# Patient Record
Sex: Female | Born: 1980 | Race: White | Hispanic: No | Marital: Married | Smoking: Never smoker
Health system: Southern US, Community
[De-identification: ages and names within clinical notes are randomized; demographics above are authoritative.]

## PROBLEM LIST (undated history)

## (undated) ENCOUNTER — Inpatient Hospital Stay (HOSPITAL_COMMUNITY): Payer: Self-pay

## (undated) DIAGNOSIS — Z789 Other specified health status: Secondary | ICD-10-CM

## (undated) DIAGNOSIS — R87629 Unspecified abnormal cytological findings in specimens from vagina: Secondary | ICD-10-CM

## (undated) HISTORY — PX: COLPOSCOPY VULVA W/ BIOPSY: SUR282

## (undated) HISTORY — PX: ADENOIDECTOMY AND MYRINGOTOMY WITH TUBE PLACEMENT: SHX5714

## (undated) HISTORY — PX: KNEE SURGERY: SHX244

---

## 2011-04-10 ENCOUNTER — Encounter: Payer: Self-pay | Admitting: *Deleted

## 2011-04-10 ENCOUNTER — Emergency Department (HOSPITAL_BASED_OUTPATIENT_CLINIC_OR_DEPARTMENT_OTHER)
Admission: EM | Admit: 2011-04-10 | Discharge: 2011-04-11 | Disposition: A | Discharge status: Home / Self Care | Payer: BC Managed Care – PPO | Attending: Emergency Medicine | Admitting: Emergency Medicine

## 2011-04-10 ENCOUNTER — Emergency Department (INDEPENDENT_AMBULATORY_CARE_PROVIDER_SITE_OTHER): Payer: BC Managed Care – PPO

## 2011-04-10 DIAGNOSIS — J029 Acute pharyngitis, unspecified: Secondary | ICD-10-CM

## 2011-04-10 DIAGNOSIS — R061 Stridor: Secondary | ICD-10-CM

## 2011-04-10 DIAGNOSIS — R064 Hyperventilation: Secondary | ICD-10-CM | POA: Insufficient documentation

## 2011-04-10 DIAGNOSIS — R109 Unspecified abdominal pain: Secondary | ICD-10-CM

## 2011-04-10 LAB — DIFFERENTIAL
Basophils Absolute: 0 10*3/uL (ref 0.0–0.1)
Basophils Relative: 0 % (ref 0–1)
Eosinophils Relative: 1 % (ref 0–5)
Monocytes Absolute: 1.5 10*3/uL — ABNORMAL HIGH (ref 0.1–1.0)
Neutro Abs: 12.4 10*3/uL — ABNORMAL HIGH (ref 1.7–7.7)

## 2011-04-10 LAB — BASIC METABOLIC PANEL
BUN: 13 mg/dL (ref 6–23)
Calcium: 10.3 mg/dL (ref 8.4–10.5)
Chloride: 99 mEq/L (ref 96–112)
Creatinine, Ser: 0.8 mg/dL (ref 0.50–1.10)
GFR calc Af Amer: >90 mL/min (ref >90)

## 2011-04-10 LAB — CBC
HCT: 42.4 % (ref 36.0–46.0)
MCHC: 35.1 g/dL (ref 30.0–36.0)
MCV: 88.5 fL (ref 78.0–100.0)
Platelets: 364 10*3/uL (ref 150–400)
RDW: 12.2 % (ref 11.5–15.5)

## 2011-04-10 LAB — RAPID STREP SCREEN (MED CTR MEBANE ONLY): Streptococcus, Group A Screen (Direct): NEGATIVE

## 2011-04-10 MED ORDER — ACETAMINOPHEN 325 MG PO TABS
650.0000 mg | ORAL_TABLET | Freq: Once | ORAL | Status: AC
  Administered 2011-04-10: 650 mg via ORAL

## 2011-04-10 MED ORDER — DEXAMETHASONE SODIUM PHOSPHATE 10 MG/ML IJ SOLN
10.0000 mg | Freq: Once | INTRAMUSCULAR | Status: AC
  Administered 2011-04-10: 10 mg via INTRAVENOUS

## 2011-04-10 MED ORDER — RACEPINEPHRINE HCL 2.25 % IN NEBU
0.5000 mL | INHALATION_SOLUTION | Freq: Once | RESPIRATORY_TRACT | Status: AC
  Administered 2011-04-10: 1 mL via RESPIRATORY_TRACT

## 2011-04-10 MED ORDER — SODIUM CHLORIDE 0.9 % IV SOLN
Freq: Once | INTRAVENOUS | Status: AC
  Administered 2011-04-10: 23:00:00 via INTRAVENOUS

## 2011-04-10 MED ORDER — ACETAMINOPHEN 160 MG/5ML PO SOLN
ORAL | Status: AC
  Filled 2011-04-10: qty 20.3

## 2011-04-10 MED ORDER — ACETAMINOPHEN 325 MG PO TABS
ORAL_TABLET | ORAL | Status: AC
  Administered 2011-04-10: 650 mg via ORAL
  Filled 2011-04-10: qty 2

## 2011-04-10 NOTE — ED Notes (Signed)
Pt c/o sore throat that began last night. Tonight pt became SOB and has a croupy cough. Pt able to swallow without difficulty.

## 2011-04-10 NOTE — ED Notes (Signed)
Pt bypassed triage and taken to room 13 due to emergent nature. Dr. Read Drivers and RT at bedside on arrival.

## 2011-04-10 NOTE — ED Notes (Signed)
After breathing treatment and IV steroids pt's breathing is now non-labored and pt does not have croupy cough. Pt's respirations have slowed and are even and regular. Pt no longer in respiratory distress.

## 2011-04-10 NOTE — ED Provider Notes (Addendum)
Scribed for Hanley Seamen, MD, the patient was seen in room 13 . This chart was scribed by Ellie Lunch.   CSN: [id number] Arrival date & time: 04/10/2011 10:40 PM   First MD Initiated Contact with Patient 04/10/11 2233      Chief Complaint  Patient presents with  . Respiratory Distress    (Consider location/radiation/quality/duration/timing/severity/associated sxs/prior treatment) HPI Level 5 caveat for urgent need for intervention. Pt developed sore throat last night. Tonight became SOB and has croupy cough. Pt arrived in mild respiratory distress. Nursing staff noted Pt had temp of 103. Respiratory therapist started racemic epinephrine.   History reviewed. No pertinent past medical history.  Past Surgical History  Procedure Date  . Knee surgery     No family history on file.  History  Substance Use Topics  . Smoking status: Never Smoker   . Smokeless tobacco: Not on file  . Alcohol Use: No    Review of Systems  Unable to perform ROS: Other  Constitutional: Positive for fever.  Respiratory: Positive for cough and shortness of breath.   Need for immediate intervention  Allergies  Review of patient's allergies indicates no known allergies.  Home Medications  No current outpatient prescriptions on file.  BP 164/99  Pulse 144  Temp(Src) 103 F (39.4 C) (Oral)  Resp 28  SpO2 100%  LMP 03/11/2011  Physical Exam  Nursing note and vitals reviewed. Constitutional: She is oriented to person, place, and time. She appears well-developed. She appears distressed (mildly).  HENT:  Head: Normocephalic and atraumatic.  Mouth/Throat: Oropharynx is clear and moist. No oropharyngeal exudate.  Eyes: Conjunctivae and EOM are normal.  Neck: Neck supple.  Cardiovascular: Regular rhythm.        tachycardic  Pulmonary/Chest:       stridorus and dysphonia Mild respiratory distress tachypnea  Abdominal: Soft. There is no tenderness.  Musculoskeletal: She exhibits no edema  and no tenderness.       caropedal spasms on extremeties  Neurological: She is alert and oriented to person, place, and time.  Skin: Skin is warm and dry.    ED Course  Procedures (including critical care time)  Oxygen saturation is 100% on room air, normal by my interpretation.   Nursing notes and vitals signs, including pulse oximetry, reviewed.   Summary of this visit's results, reviewed by myself:  Results for orders placed during the hospital encounter of 04/10/11  RAPID STREP SCREEN      Component Value Range   Streptococcus, Group A Screen (Direct) NEGATIVE  NEGATIVE   BASIC METABOLIC PANEL      Component Value Range   Sodium 139  135 - 145 (mEq/L)   Potassium 3.0 (*) 3.5 - 5.1 (mEq/L)   Chloride 99  96 - 112 (mEq/L)   CO2 23  19 - 32 (mEq/L)   Glucose, Bld 127 (*) 70 - 99 (mg/dL)   BUN 13  6 - 23 (mg/dL)   Creatinine, Ser 2.72  0.50 - 1.10 (mg/dL)   Calcium 53.6  8.4 - 10.5 (mg/dL)   GFR calc non Af Amer >90  >90 (mL/min)   GFR calc Af Amer >90  >90 (mL/min)  CBC      Component Value Range   WBC 18.4 (*) 4.0 - 10.5 (K/uL)   RBC 4.79  3.87 - 5.11 (MIL/uL)   Hemoglobin 14.9  12.0 - 15.0 (g/dL)   HCT 64.4  03.4 - 74.2 (%)   MCV 88.5  78.0 - 100.0 (fL)  MCH 31.1  26.0 - 34.0 (pg)   MCHC 35.1  30.0 - 36.0 (g/dL)   RDW 09.8  11.9 - 14.7 (%)   Platelets 364  150 - 400 (K/uL)  DIFFERENTIAL      Component Value Range   Neutrophils Relative 67  43 - 77 (%)   Neutro Abs 12.4 (*) 1.7 - 7.7 (K/uL)   Lymphocytes Relative 24  12 - 46 (%)   Lymphs Abs 4.4 (*) 0.7 - 4.0 (K/uL)   Monocytes Relative 8  3 - 12 (%)   Monocytes Absolute 1.5 (*) 0.1 - 1.0 (K/uL)   Eosinophils Relative 1  0 - 5 (%)   Eosinophils Absolute 0.2  0.0 - 0.7 (K/uL)   Basophils Relative 0  0 - 1 (%)   Basophils Absolute 0.0  0.0 - 0.1 (K/uL)   Dg Neck Soft Tissue  04/10/2011  *RADIOLOGY REPORT*  Clinical Data: Dyspnea.  Sore throat.  Question obstruction.  NECK SOFT TISSUES - 1+ VIEW  Comparison:  None.  Findings: Single lateral soft tissue view of the neck.  The airway is widely patent.  No prevertebral soft tissue swelling. Epiglottis normal.  Aryepiglottic folds within normal limits.  No radiopaque foreign object.  IMPRESSION: No evidence of obstruction or explanation for dyspnea/sore throat.  Original Report Authenticated By: Consuello Bossier, M.D.    10:47 PM Stridor relieved and  dyspnea improved after racemic epinephrine. Instructed to slow breathing to improve carpopedal spasms.    MDM  I personally performed the services described in this documentation, which was scribed in my presence.  The recorded information has been reviewed and considered.  12:10 AM Patient resting comfortably. Tachycardia is improved. No stridor heard. This finding has improved. We'll start antibiotics for possible bacterial etiology.  1:54 AM Patient observed in ED for 3 hours post racemic epinephrine. Patient continues to be stridor free. She is comfortable. She is slightly dysphonic. Oropharynx still without edema or exudate. Patient's significant other will be with her tonight and will return her or call EMS should she worsen.  CRITICAL CARE Performed by: Paula Libra L   Total critical care time: 35  Critical care time was exclusive of separately billable procedures and treating other patients.  Critical care was necessary to treat or prevent imminent or life-threatening deterioration.  Critical care was time spent personally by me on the following activities: development of treatment plan with patient and/or surrogate as well as nursing, discussions with consultants, evaluation of patient's response to treatment, examination of patient, obtaining history from patient or surrogate, ordering and performing treatments and interventions, ordering and review of laboratory studies, ordering and review of radiographic studies, pulse oximetry and re-evaluation of patient's condition.     Hanley Seamen,  MD 04/11/11 8295  Hanley Seamen, MD 04/11/11 0157

## 2011-04-10 NOTE — ED Notes (Signed)
Pt unable to swallow tylenol tablets. Liquid tylenol used instead.

## 2011-04-10 NOTE — ED Notes (Signed)
Pt continues to improve, breathing is not labored. Pt speaking in complete sentences. Pt ambulated to bathroom with minimal assistance from family due to foot pain.

## 2011-04-11 MED ORDER — PENICILLIN G BENZATHINE 1200000 UNIT/2ML IM SUSP
1.2000 10*6.[IU] | Freq: Once | INTRAMUSCULAR | Status: AC
  Administered 2011-04-11: 1.2 10*6.[IU] via INTRAMUSCULAR
  Filled 2011-04-11: qty 2

## 2011-04-17 LAB — CULTURE, BLOOD (ROUTINE X 2)

## 2012-03-18 LAB — OB RESULTS CONSOLE ANTIBODY SCREEN: Antibody Screen: NEGATIVE

## 2012-03-18 LAB — OB RESULTS CONSOLE HIV ANTIBODY (ROUTINE TESTING): HIV: NONREACTIVE

## 2012-03-18 LAB — OB RESULTS CONSOLE HEPATITIS B SURFACE ANTIGEN: Hepatitis B Surface Ag: NEGATIVE

## 2012-03-18 LAB — OB RESULTS CONSOLE ABO/RH: RH Type: POSITIVE

## 2012-03-18 LAB — OB RESULTS CONSOLE GC/CHLAMYDIA: Chlamydia: NEGATIVE

## 2012-06-19 NOTE — L&D Delivery Note (Signed)
Delivery Note  First Stage: Labor onset: 0630 Augmentation : pitocin for protracted active labor with persistent OP Analgesia /Anesthesia intrapartum: epidural SROM at 0645  Second Stage: Complete dilation at 2244 Onset of pushing at 2250 FHR second stage 150 with variable to nadir 100 with pushing / + accels  Delivery of a viable female at 2329 by CNM in LOA position loose nuchal cord x 1 reduced over head at birth Cord double clamped after cessation of pulsation, cut by FOB Cord blood sample collected   Third Stage: Placenta delivered spontaneous intact with 3 VC @ 2336 Placenta disposition: for disposal Uterine tone firm / bleeding small  No perineal laceration identified / brisk bleeding from left side wall - visualized large vessel with active bleeding 2- figure of eights placed for hemostasis with 3-0 vicryl / good hemostasis without expansion at site - no evidence of bleeding or hematoma at site Anesthesia for repair: epidural  Est. Blood Loss (mL): 200  Complications: none  Mom to postpartum.  Baby to MOM for bonding.   Marlinda Mike CNM, MSN 10/06/2012, 11:55 PM

## 2012-09-13 ENCOUNTER — Encounter (HOSPITAL_COMMUNITY): Payer: Self-pay | Admitting: *Deleted

## 2012-09-13 ENCOUNTER — Inpatient Hospital Stay (HOSPITAL_COMMUNITY)
Admission: AD | Admit: 2012-09-13 | Discharge: 2012-09-13 | Disposition: A | Discharge status: Home / Self Care | Source: Ambulatory Visit | Attending: Obstetrics and Gynecology | Admitting: Obstetrics and Gynecology

## 2012-09-13 ENCOUNTER — Inpatient Hospital Stay (HOSPITAL_COMMUNITY)

## 2012-09-13 DIAGNOSIS — O368139 Decreased fetal movements, third trimester, other fetus: Secondary | ICD-10-CM

## 2012-09-13 DIAGNOSIS — O368190 Decreased fetal movements, unspecified trimester, not applicable or unspecified: Secondary | ICD-10-CM | POA: Insufficient documentation

## 2012-09-13 MED ORDER — AZITHROMYCIN 250 MG PO TABS: ORAL_TABLET | ORAL | Status: AC

## 2012-09-13 MED ORDER — BENZONATATE 200 MG PO CAPS: 200.0000 mg | ORAL_CAPSULE | Freq: Three times a day (TID) | ORAL | Status: DC | PRN

## 2012-09-13 NOTE — MAU Note (Signed)
Hasn't felt any movement today, bay is usually really active. Been sick past couple wks.  Coughing a lot.

## 2012-09-13 NOTE — MAU Note (Signed)
Patient presents to MAU with c/o decreased fetal movement. Reports she was in bed most of day then went to work at 1500. Reports she drank OJ and still did not feel baby movement.  Denies vaginal bleeding, LOF or cramping. Took Robitussin at 1000 and 1400 today; reports she took Afrin throughout the day.

## 2012-09-13 NOTE — MAU Provider Note (Signed)
  History     CSN: [id number]  Arrival date and time: 09/13/12 1823 Nurse call to provider - orders given @ 1920 Provider here to see patient @ 2015    Chief Complaint  Patient presents with  . Decreased Fetal Movement   HPI  Decreased FM all day URI - feels misearable  History reviewed. No pertinent past medical history.  Past Surgical History  Procedure Laterality Date  . Knee surgery      History reviewed. No pertinent family history.  History  Substance Use Topics  . Smoking status: Never Smoker   . Smokeless tobacco: Not on file  . Alcohol Use: No    Allergies: No Known Allergies  No prescriptions prior to admission    ROS Physical Exam   Blood pressure 134/89, pulse 89, temperature 98.2 F (36.8 C), temperature source Oral, resp. rate 20, height 5' 5.5" (1.664 m), weight 93.895 kg (207 lb).  Physical Exam  MAU Course  Procedures  NST - reactive  ULTRAOUND FETAL BPP W/O NONSTRESS  BIOPHYSICAL PROFILE  Number of Fetuses: 1  Heart Rate: 146bpm  Movement: Yes  Presentation: Cephalic  Placental Location: Not assessed  Previa: N/A  Amniotic Fluid (subjective): Not assessed  MATERNAL FINDINGS:  Cervix: Not assessed  Uterus/Adnexae: Not assessed  BPP:  Movement: 2 Time: 8 min  Breathing: 2  Tone: 2  Amniotic Fluid: 2  Total Score: 8/8  Impression:  Single live intrauterine gestation in cephalic presentation.  Fetal BPP 8/8.    Assessment and Plan  33 5/7 decreased FM - reactive NST / Normal AFI / BPP Fetal kick counts  Supportive care for URI   Marlinda Mike 09/13/2012, 8:19 PM

## 2012-10-06 ENCOUNTER — Inpatient Hospital Stay (HOSPITAL_COMMUNITY)
Admission: AD | Admit: 2012-10-06 | Discharge: 2012-10-08 | DRG: 775 | Disposition: A | Discharge status: Home / Self Care | Source: Ambulatory Visit | Attending: Obstetrics and Gynecology | Admitting: Obstetrics and Gynecology

## 2012-10-06 ENCOUNTER — Inpatient Hospital Stay (HOSPITAL_COMMUNITY): Admitting: Anesthesiology

## 2012-10-06 ENCOUNTER — Encounter (HOSPITAL_COMMUNITY): Payer: Self-pay | Admitting: Anesthesiology

## 2012-10-06 DIAGNOSIS — IMO0001 Reserved for inherently not codable concepts without codable children: Secondary | ICD-10-CM

## 2012-10-06 DIAGNOSIS — O139 Gestational [pregnancy-induced] hypertension without significant proteinuria, unspecified trimester: Principal | ICD-10-CM | POA: Diagnosis present

## 2012-10-06 LAB — CBC
HCT: 37.1 % (ref 36.0–46.0)
HCT: 36.3 % (ref 36.0–46.0)
Hemoglobin: 12.5 g/dL (ref 12.0–15.0)
Hemoglobin: 12.4 g/dL (ref 12.0–15.0)
MCH: 31 pg (ref 26.0–34.0)
MCH: 31.2 pg (ref 26.0–34.0)
MCHC: 33.7 g/dL (ref 30.0–36.0)
MCHC: 34.2 g/dL (ref 30.0–36.0)
MCV: 92.1 fL (ref 78.0–100.0)
MCV: 91.4 fL (ref 78.0–100.0)
Platelets: 222 10*3/uL (ref 150–400)
Platelets: 203 10*3/uL (ref 150–400)
RBC: 4.03 MIL/uL (ref 3.87–5.11)
RBC: 3.97 MIL/uL (ref 3.87–5.11)
RDW: 14 % (ref 11.5–15.5)
RDW: 13.8 % (ref 11.5–15.5)
WBC: 13.1 10*3/uL — ABNORMAL HIGH (ref 4.0–10.5)
WBC: 21.7 10*3/uL — ABNORMAL HIGH (ref 4.0–10.5)

## 2012-10-06 LAB — COMPREHENSIVE METABOLIC PANEL
ALT: 20 U/L (ref 0–35)
AST: 17 U/L (ref 0–37)
Albumin: 2.5 g/dL — ABNORMAL LOW (ref 3.5–5.2)
Alkaline Phosphatase: 136 U/L — ABNORMAL HIGH (ref 39–117)
BUN: 8 mg/dL (ref 6–23)
CO2: 22 mEq/L (ref 19–32)
Calcium: 9.3 mg/dL (ref 8.4–10.5)
Chloride: 103 mEq/L (ref 96–112)
Creatinine, Ser: 0.67 mg/dL (ref 0.50–1.10)
GFR calc Af Amer: >90 mL/min (ref >90)
GFR calc non Af Amer: >90 mL/min (ref >90)
Glucose, Bld: 80 mg/dL (ref 70–99)
Potassium: 3.9 mEq/L (ref 3.5–5.1)
Sodium: 136 mEq/L (ref 135–145)
Total Bilirubin: 0.2 mg/dL — ABNORMAL LOW (ref 0.3–1.2)
Total Protein: 5.4 g/dL — ABNORMAL LOW (ref 6.0–8.3)

## 2012-10-06 LAB — URIC ACID: Uric Acid, Serum: 5 mg/dL (ref 2.4–7.0)

## 2012-10-06 LAB — LACTATE DEHYDROGENASE: LDH: 158 U/L (ref 94–250)

## 2012-10-06 LAB — RPR: RPR Ser Ql: NONREACTIVE

## 2012-10-06 MED ORDER — ACETAMINOPHEN 325 MG PO TABS: 650.0000 mg | ORAL_TABLET | ORAL | Status: DC | PRN

## 2012-10-06 MED ORDER — SODIUM CHLORIDE 0.9 % IJ SOLN: 3.0000 mL | INTRAMUSCULAR | Status: DC | PRN

## 2012-10-06 MED ORDER — FENTANYL 2.5 MCG/ML BUPIVACAINE 1/10 % EPIDURAL INFUSION (WH - ANES)
INTRAMUSCULAR | Status: DC | PRN
  Administered 2012-10-06: 14 mL/h via EPIDURAL

## 2012-10-06 MED ORDER — PHENYLEPHRINE 40 MCG/ML (10ML) SYRINGE FOR IV PUSH (FOR BLOOD PRESSURE SUPPORT)
80.0000 ug | PREFILLED_SYRINGE | INTRAVENOUS | Status: DC | PRN
  Filled 2012-10-06: qty 2

## 2012-10-06 MED ORDER — LACTATED RINGERS IV SOLN: 500.0000 mL | Freq: Once | INTRAVENOUS | Status: DC

## 2012-10-06 MED ORDER — OXYTOCIN BOLUS FROM INFUSION
500.0000 mL | INTRAVENOUS | Status: DC
  Administered 2012-10-06: 500 mL via INTRAVENOUS

## 2012-10-06 MED ORDER — LACTATED RINGERS IV SOLN: 500.0000 mL | INTRAVENOUS | Status: DC | PRN

## 2012-10-06 MED ORDER — SALINE SPRAY 0.65 % NA SOLN
1.0000 | NASAL | Status: DC | PRN
  Filled 2012-10-06: qty 44

## 2012-10-06 MED ORDER — FENTANYL 2.5 MCG/ML BUPIVACAINE 1/10 % EPIDURAL INFUSION (WH - ANES)
14.0000 mL/h | INTRAMUSCULAR | Status: DC | PRN
  Filled 2012-10-06: qty 125

## 2012-10-06 MED ORDER — EPHEDRINE 5 MG/ML INJ
10.0000 mg | INTRAVENOUS | Status: DC | PRN
  Filled 2012-10-06: qty 2
  Filled 2012-10-06: qty 4

## 2012-10-06 MED ORDER — OXYTOCIN 40 UNITS IN LACTATED RINGERS INFUSION - SIMPLE MED
1.0000 m[IU]/min | INTRAVENOUS | Status: DC
  Administered 2012-10-06: 2 m[IU]/min via INTRAVENOUS
  Filled 2012-10-06: qty 1000

## 2012-10-06 MED ORDER — SODIUM CHLORIDE 0.9 % IJ SOLN: 3.0000 mL | Freq: Two times a day (BID) | INTRAMUSCULAR | Status: DC

## 2012-10-06 MED ORDER — LIDOCAINE HCL (PF) 1 % IJ SOLN
30.0000 mL | INTRAMUSCULAR | Status: DC | PRN
  Filled 2012-10-06 (×2): qty 30

## 2012-10-06 MED ORDER — IBUPROFEN 600 MG PO TABS: 600.0000 mg | ORAL_TABLET | Freq: Four times a day (QID) | ORAL | Status: DC | PRN

## 2012-10-06 MED ORDER — CITRIC ACID-SODIUM CITRATE 334-500 MG/5ML PO SOLN: 30.0000 mL | ORAL | Status: DC | PRN

## 2012-10-06 MED ORDER — DIPHENHYDRAMINE HCL 50 MG/ML IJ SOLN: 12.5000 mg | INTRAMUSCULAR | Status: DC | PRN

## 2012-10-06 MED ORDER — PHENYLEPHRINE 40 MCG/ML (10ML) SYRINGE FOR IV PUSH (FOR BLOOD PRESSURE SUPPORT)
80.0000 ug | PREFILLED_SYRINGE | INTRAVENOUS | Status: DC | PRN
  Filled 2012-10-06: qty 2
  Filled 2012-10-06: qty 5

## 2012-10-06 MED ORDER — OXYTOCIN 40 UNITS IN LACTATED RINGERS INFUSION - SIMPLE MED: 62.5000 mL/h | INTRAVENOUS | Status: DC

## 2012-10-06 MED ORDER — SODIUM CHLORIDE 0.9 % IV SOLN: 250.0000 mL | INTRAVENOUS | Status: DC | PRN

## 2012-10-06 MED ORDER — LIDOCAINE HCL (PF) 1 % IJ SOLN
INTRAMUSCULAR | Status: DC | PRN
  Administered 2012-10-06 (×2): 5 mL
  Administered 2012-10-06: 3 mL

## 2012-10-06 MED ORDER — EPHEDRINE 5 MG/ML INJ
10.0000 mg | INTRAVENOUS | Status: DC | PRN
  Filled 2012-10-06: qty 2

## 2012-10-06 NOTE — Progress Notes (Signed)
S:  pressure - urge to push  O:  VS: Blood pressure 148/80, pulse 87, temperature 99.4 F (37.4 C), temperature source Axillary, resp. rate 20, height 5' 5.5" (1.664 m), weight 210 lb (95.255 kg), SpO2 100.00%.        FHR : baseline 155 / variability moderate / accels + / decels variable to nadir 100        Toco: contractions every 2-3 minutes / pitocin 4 mu/min         Cervix : complete vtx + 3        Membranes: clear fluid / + show  A: Complete dilation - begin active second stage     P: anticipate SVB in next hour     Marlinda Mike CNM, MSN 10/06/2012, [phone]

## 2012-10-06 NOTE — MAU Note (Signed)
Pt presents with complaints of her water broke between 630-650. States fluid was blood tinged, contractions started right before her water broke.

## 2012-10-06 NOTE — Anesthesia Preprocedure Evaluation (Signed)

## 2012-10-06 NOTE — MAU Provider Note (Signed)
  History     CSN: [id number]  Arrival date and time: 10/06/12 4098 Provider here to evaluate patient @ 0800    No chief complaint on file.  HPI  water broke - reports bloody fluid constant cramping and abdominal tightness + CTX Denies headache or vision changes  No past medical history on file.  Past Surgical History  Procedure Laterality Date  . Knee surgery      No family history on file.  History  Substance Use Topics  . Smoking status: Never Smoker   . Smokeless tobacco: Not on file  . Alcohol Use: No    Allergies: No Known Allergies  Prescriptions prior to admission  Medication Sig Dispense Refill  . benzonatate (TESSALON) 200 MG capsule Take 1 capsule (200 mg total) by mouth 3 (three) times daily as needed for cough.  20 capsule  0    ROS Physical Exam   There were no vitals taken for this visit.  Physical Exam  Temp:97.8  Pulse:79  Resp:20   BP  Initial:152/105 recheck lateral position:130/80 Uncomfortable - painful ctx - breathing with ctx Heart RR Lungs clear - no wheezing (recent bronchitis) / non-productive cough present Abdomen soft with active BS / uterus gravid and non-tender Grossly ruptured with moderate amount clear fluid / + ferning Cervix: 3cm / 80% / vtx / 0 station ROT / small show in fluid Edema: 1+  DTR 3+ brisk without clonus  FHR 145 baseline / moderate variability / no decels / no accels  MAU Course  Procedures  Assessment and Plan  37 weeks with SROM latent labor elevated BP  1) admit 2) PIH signs 3) discuss labor and pain management option once labs resulted   Casey Todd 10/06/2012, 8:03 AM

## 2012-10-06 NOTE — Progress Notes (Signed)
S:  Pressure with ctx - feeling urge to push  O:  VS: Blood pressure 136/78, pulse 96, temperature 98.5 F (36.9 C), temperature source Oral, resp. rate 20, height 5' 5.5" (1.664 m), weight 210 lb (95.255 kg).        FHR : 155 no decels audible        Toco: contractions every 3-4 minutes         Cervix : anterior rim / vtx + 2        Membranes: bloody show         Out of tub to void        Water temperature 98.5 - warm water added to 99  A: transition of labor  P: labor support   Marlinda Mike CNM, MSN 10/06/2012, 5:26 PM

## 2012-10-06 NOTE — Progress Notes (Addendum)
S: laboring in tub - breathing hard with ctx      moaning - pain getting worse   O:  VS: Blood pressure 143/82, pulse 91, temperature 98.7 F (37.1 C), temperature source Oral, resp. rate 20, height 5' 5.5" (1.664 m), weight 210 lb (95.255 kg).        FHR : baseline 150 / variability moderate / accels + / decels none prior to entering tub                  Intermittent EFM while in tub        Toco: contractions every 2 minutes / strong         Cervix : 8cm / 100% vtx / +1        Membranes: clear fluid / + show        Water temp 99.1 @ 1430 when patient entered water  A: active labor - BP stable / no PEC     FHR category 1 - now intermittent EFMm monitoring  P: expectant management       continuous labor support      water immersion in labor - possible waterbirth  Marlinda Mike CNM, MSN 10/06/2012, 3:13 PM

## 2012-10-06 NOTE — Progress Notes (Signed)
S:  Comfortable now with epidural  O:  VS: Blood pressure 140/86, pulse 90, temperature 97.9 F (36.6 C), temperature source Oral, resp. rate 18, height 5' 5.5" (1.664 m), weight 210 lb (95.255 kg), SpO2 99.00%.        FHR : baseline 155 / variability moderate / accels + / decels none        Toco: contractions every 2-4 minutes / moderate with some coupling         Cervix : anterior rim / persistent OP        Membranes: clear with show  Labored in tub - no progression past 9cm in 2 hours  Out of tub  - recheck cervix: anterior rim / vtx + 1 persistent OP extended asynclitic  IUPC placed to assess ctx - MVU 160-180 range IUPC out while up to bathroom - not replaced due to patient discomfort / intense pain with ctx awaiting epidural  Recommend epidural and pitocin augmentation to facilitate SVB - patient agrees  Cervix exam after epidural anterior rim / extended vtx OP - manual flexion of vtx  A: protracted active labor     FHR category 1  P: pitocin augment now      attempt SVB      Dr Seymour Bars update on status     Casey Todd CNM, MSN 10/06/2012, 9:05 PM

## 2012-10-06 NOTE — Progress Notes (Signed)
Difficult to trace continually due to maternal position on birthing ball in shower.  Pt reports good fetal movement.

## 2012-10-06 NOTE — Progress Notes (Signed)
S:  ctx really hard- some pressure with ctx       laboring in bathtub but unable to get comfortable due to restriction of space       pain at IV site   O:  VS: Blood pressure 134/76, pulse 93, temperature 98.5 F (36.9 C), temperature source Oral, resp. rate 20, height 5' 5.5" (1.664 m), weight 210 lb (95.255 kg).        BP stable        FHR : baseline 150 / variability moderate / accels + / decels none        Toco: contractions every 2-3 minutes / moderate         Cervix : 6-7cm / 100% / vtx +1 asynclitic         Membranes: clear fluid with show        IV catheter bent / infiltrated - IV removed   A: active labor - desires waterbirth (no tub supplies obtained due to gestational age)     Gestational hypertension     FHR category 1  P: expectant management      attempting to locate waterbirth supplies for patient      maternal positioning and water immersion to facilitate fetal positioning   Marlinda Mike CNM, MSN 10/06/2012, 1:27 PM

## 2012-10-06 NOTE — H&P (Signed)
  OB ADMISSION/ HISTORY & PHYSICAL:  Admission Date: 10/06/2012  7:56 AM  Admit Diagnosis: SROM / latent labor / elevated BP  Casey Todd is a 32 y.o. female presenting for labor onset and SROM.  Prenatal History: G1P0   EDC : 10/27/2012, Alternate EDD Entry  Prenatal care at [facility] Provider: Marlinda Mike CNM Prenatal course complicated by bronchitis  Prenatal Labs: ABO, Rh:   O positive Antibody:  negative Rubella:   immune RPR:  NR  HBsAg:   negative HIV:   NR GBS:   negative 1 hr Glucola : NL  Medical / Surgical History :  Past medical history: No past medical history on file.   Past surgical history:  Past Surgical History  Procedure Laterality Date  . Knee surgery      Family History: No family history on file.   Social History:  reports that she has never smoked. She does not have any smokeless tobacco history on file. She reports that she does not drink alcohol or use illicit drugs.  Allergies: Other   Current Medications at time of admission:  Prenatal  Review of Systems: Ctx onset with SROM at 0700  Physical Exam:  VS: Blood pressure 130/80, pulse 79, temperature 97.8 F (36.6 C), temperature source Oral, resp. rate 18.  General: alert and oriented, appears uncomfortable with ctx Heart: RRR Lungs: Clear lung fields Abdomen: Gravid, soft and non-tender, non-distended / uterus: soft / gravid / non-tender Extremities: 2+ dependent edema  Genitalia / VE: Dilation: 3 Effacement (%): 80 Station: 0 Exam by:: Marlinda Mike CNM  FHR: baseline rate 145 / variability moderate / accels none / decels none TOCO: ctx every 2-4 minutes mild - moderate  Assessment: [redacted] weeks gestation latent stage of labor FHR category 1   Plan:  Admit PIH labs Dr [place] notified of admission / plan of care   Marlinda Mike CNM, MSN 10/06/2012, 8:24 AM

## 2012-10-06 NOTE — Progress Notes (Signed)
S:  Ctx getting stronger       laboring in chair / birthing ball in shower  O:  VS: Blood pressure 145/101, pulse 69, temperature 97.9 F (36.6 C), temperature source Oral, resp. rate 20, height 5' 5.5" (1.664 m), weight 210 lb (95.255 kg).  BP range: 136-145/ 80-101         FHR : baseline 150 / variability moderate        Toco: contractions every 2-3 minutes / moderate        Cervix : 5cm /80% vtx / 0 station        Membranes: clear fluid with bloody show  PIH labs NL  A: active labor     gestational hypertension - no evidence of PEC     FHR category 1  P: expectant management     Marlinda Mike CNM, MSN 10/06/2012, 12:10 PM

## 2012-10-06 NOTE — Anesthesia Procedure Notes (Signed)
Epidural Patient location during procedure: OB  Staffing Anesthesiologist: Mattalynn Crandle Performed by: anesthesiologist   Preanesthetic Checklist Completed: patient identified, site marked, surgical consent, pre-op evaluation, timeout performed, IV checked, risks and benefits discussed and monitors and equipment checked  Epidural Patient position: sitting Prep: ChloraPrep Patient monitoring: heart rate, continuous pulse ox and blood pressure Approach: midline Injection technique: LOR saline  Needle:  Needle type: Tuohy  Needle gauge: 17 G Needle length: 9 cm and 9 Needle insertion depth: 6 cm Catheter type: closed end flexible Catheter size: 20 Guage Catheter at skin depth: 12 cm Test dose: negative  Assessment Events: blood not aspirated, injection not painful, no injection resistance, negative IV test and no paresthesia  Additional Notes   Patient tolerated the insertion well without complications.   

## 2012-10-07 ENCOUNTER — Encounter (HOSPITAL_COMMUNITY): Payer: Self-pay | Admitting: Family Medicine

## 2012-10-07 LAB — ABO/RH: ABO/RH(D): O POS

## 2012-10-07 MED ORDER — BENZOCAINE-MENTHOL 20-0.5 % EX AERO
1.0000 "application " | INHALATION_SPRAY | CUTANEOUS | Status: DC | PRN
  Filled 2012-10-07: qty 56

## 2012-10-07 MED ORDER — OXYCODONE-ACETAMINOPHEN 5-325 MG PO TABS: 1.0000 | ORAL_TABLET | ORAL | Status: DC | PRN

## 2012-10-07 MED ORDER — ONDANSETRON HCL 4 MG/2ML IJ SOLN: 4.0000 mg | INTRAMUSCULAR | Status: DC | PRN

## 2012-10-07 MED ORDER — ONDANSETRON HCL 4 MG PO TABS: 4.0000 mg | ORAL_TABLET | ORAL | Status: DC | PRN

## 2012-10-07 MED ORDER — DIPHENHYDRAMINE HCL 25 MG PO CAPS: 25.0000 mg | ORAL_CAPSULE | Freq: Four times a day (QID) | ORAL | Status: DC | PRN

## 2012-10-07 MED ORDER — SENNOSIDES-DOCUSATE SODIUM 8.6-50 MG PO TABS
2.0000 | ORAL_TABLET | Freq: Every day | ORAL | Status: DC
  Administered 2012-10-07: 2 via ORAL

## 2012-10-07 MED ORDER — WITCH HAZEL-GLYCERIN EX PADS: 1.0000 "application " | MEDICATED_PAD | CUTANEOUS | Status: DC | PRN

## 2012-10-07 MED ORDER — SIMETHICONE 80 MG PO CHEW: 80.0000 mg | CHEWABLE_TABLET | ORAL | Status: DC | PRN

## 2012-10-07 MED ORDER — IBUPROFEN 600 MG PO TABS
600.0000 mg | ORAL_TABLET | Freq: Four times a day (QID) | ORAL | Status: DC
  Administered 2012-10-07 – 2012-10-08 (×6): 600 mg via ORAL
  Filled 2012-10-07 (×6): qty 1

## 2012-10-07 MED ORDER — DIBUCAINE 1 % RE OINT: 1.0000 "application " | TOPICAL_OINTMENT | RECTAL | Status: DC | PRN

## 2012-10-07 MED ORDER — LANOLIN HYDROUS EX OINT: TOPICAL_OINTMENT | CUTANEOUS | Status: DC | PRN

## 2012-10-07 NOTE — Anesthesia Postprocedure Evaluation (Signed)
  Anesthesia Post-op Note  Patient: Casey Todd  Procedure(s) Performed: * No procedures listed *  Patient Location: Mother/Baby  Anesthesia Type:Epidural  Level of Consciousness: awake, alert  and oriented  Airway and Oxygen Therapy: Patient Spontanous Breathing  Post-op Pain: mild  Post-op Assessment: Patient's Cardiovascular Status Stable and Respiratory Function Stable  Post-op Vital Signs: stable  Complications: No apparent anesthesia complications

## 2012-10-07 NOTE — Progress Notes (Signed)
Patient ID: CECEILIA CEPHUS, female   DOB: 1980/06/27, 32 y.o.   MRN: [id number] PPD # 1 G1 P1, s/p SVD  Subjective: Pt reports feeling well, tired/ Pain controlled with ibuprofen Tolerating po/ Voiding without problems/ No n/v Bleeding is moderate Newborn info:  Information for the patient's newborn:  Forest, Pruden [[id number]]  female circ desired/ Feeding: breast   Objective:  VS: Blood pressure 136/86, pulse 87, temperature 97.7 F (36.5 C), temperature source Oral, resp. rate 18.    Recent Labs  10/06/12 0830 10/06/12 2000  WBC 13.1* 21.7*  HGB 12.5 12.4  HCT 37.1 36.3  PLT 222 203    Blood type: --/--/O POS (04/20 0830) Rubella: Immune (09/30 0000)    Physical Exam:  General:  alert, cooperative and no distress CV: Regular rate and rhythm Resp: clear Abdomen: soft, nontender, normal bowel sounds Uterine Fundus: firm, below umbilicus, nontender Perineum: left labial vessel repair appears intact and no evidence of hematoma; mod right labial edema noted Lochia: mod Ext: Homans sign is negative, no sign of DVT and only mild edema, redness or tenderness in the calves or thighs   A/P: PPD # 1/ G1P1001/ S/P: SVD with left vaginal wall vessel repair, s/p successful water birth Doing well Continue routine post partum orders Anticipate D/C home in AM    Demetrius Revel, MSN, [facility] 10/07/2012, 9:49 AM

## 2012-10-08 MED ORDER — IBUPROFEN 600 MG PO TABS: 600.0000 mg | ORAL_TABLET | Freq: Four times a day (QID) | ORAL | Status: DC

## 2012-10-08 MED ORDER — IBUPROFEN 800 MG PO TABS: 800.0000 mg | ORAL_TABLET | Freq: Three times a day (TID) | ORAL | Status: DC | PRN

## 2012-10-08 NOTE — Discharge Summary (Signed)
Obstetric Discharge Summary Reason for Admission: [redacted] weeks gestation with onset latent labor; desires water birth Prenatal Procedures: ultrasound Intrapartum Procedures: spontaneous vaginal delivery Postpartum Procedures: none Complications-Operative and Postpartum: none Hemoglobin  Date Value Range Status  10/06/2012 12.4  12.0 - 15.0 g/dL Final     HCT  Date Value Range Status  10/06/2012 36.3  36.0 - 46.0 % Final    Physical Exam:  General: alert, cooperative and no distress Lochia: appropriate Uterine Fundus: firm Incision: n/a DVT Evaluation: no evidence of DVT, no edema  Discharge Diagnoses: G1 P1 @ 37wks s/p SVD with minor left labial blood vessel repair, successful water birth  Discharge Information: Date: 10/08/2012 Activity: pelvic rest Diet: routine Medications: PNV, Ibuprofen and Colace Condition: stable Instructions: refer to practice specific booklet Discharge to: home Follow-up Information   Follow up with Marlinda Mike, CNM In 6 weeks.   Contact information:   [name] [place] [address] [phone]       Newborn Data: Live born female on 10/06/12 Birth Weight: 6 lb 10.5 oz (3020 g) APGAR: 8, 9  Home with mother.  Casey Todd 10/08/2012, 9:03 AM

## 2012-10-08 NOTE — Progress Notes (Signed)
Spoke with patient about TDap vaccine and VIS given.  Patient declines vaccine at this time.  Earl Gala, [facility] [place]

## 2012-10-08 NOTE — Progress Notes (Signed)
Patient ID: Casey Todd, female   DOB: 1980-10-04, 32 y.o.   MRN: [id number]  PPD # 2 G1 P1 S/P SVD  Subjective: Pt reports feeling well and eager for d/c home later today/ Pain controlled with ibuprofen Tolerating po/ Voiding without problems/ No n/v Bleeding is moderate/ Newborn info:  Information for the patient's newborn:  Casey Todd [[id number]]  female  / circ deferred due to hypospadias/ Feeding: breast    Objective:  VS: Blood pressure 133/89, pulse 76, temperature 97.9 F (36.6 C), temperature source Oral, resp. rate 20.    Recent Labs  10/06/12 0830 10/06/12 2000  WBC 13.1* 21.7*  HGB 12.5 12.4  HCT 37.1 36.3  PLT 222 203    Blood type: --/--/O POS (04/20 0830) Rubella: Immune (09/30 0000)    Physical Exam:  General:  alert, cooperative and no distress CV: Regular rate and rhythm Resp: clear Abdomen: soft, nontender, normal bowel sounds Uterine Fundus: firm, below umbilicus, nontender Perineum: mod edema persists; left labial vessel repair is intact and without evidence of hematoma Lochia: moderate Ext: edema trace and Homans sign is negative, no sign of DVT    A/P: PPD # 2/ G1P1001/ S/P: SVD with simple left labial blood vessel repair Doing well and stable for discharge home RX: Ibuprofen 600mg  po Q 6 hrs prn pain #30 Refill x 1 Colace OTC prn WOB/GYN booklet given Routine pp visit in 6wks   Demetrius Revel, MSN, [facility] 10/08/2012, 9:01 AM

## 2013-06-18 LAB — OB RESULTS CONSOLE HIV ANTIBODY (ROUTINE TESTING): HIV: NONREACTIVE

## 2013-06-19 NOTE — L&D Delivery Note (Signed)
Delivery Note  First Stage: Labor onset: 1236 Augmentation : none Analgesia /Anesthesia intrapartum: none AROM at 2109  Second Stage: Complete dilation at 2109 Onset of pushing at 2109 FHR second stage 140  Delivery of a viable female at 2111 by CNM in OA position True knot in cord Cord double clamped immediately, cut by FOB Cord blood sample collected   Third Stage: Placenta delivered via Tomasa BlaseSchultz intact with 3 VC @ 2201 Placenta disposition: L&D Uterine tone firm / bleeding brisk, moderate / Cytotec 800 mcg given rectally  2nd degree vaginal and perineal laceration identified  Anesthesia for repair: 1% lidocaine Repair 3.0 vicryl Est. Blood Loss (mL): 400  Complications: retained placenta x 50 mins  Mom to postpartum.  Baby to Couplet care / Skin to Skin.  Newborn: Birth Weight: 7 lbs 0.9 oz  Apgar Scores: 8/9 Feeding planned: breast  Casey Todd, Casey Todd, M  MSN, CNM 01/30/2014, 10:33 PM

## 2013-07-01 LAB — OB RESULTS CONSOLE GC/CHLAMYDIA
Chlamydia: NEGATIVE
Gonorrhea: NEGATIVE

## 2013-11-13 LAB — OB RESULTS CONSOLE RPR: RPR: NONREACTIVE

## 2014-01-25 ENCOUNTER — Encounter (HOSPITAL_COMMUNITY): Payer: Self-pay

## 2014-01-25 ENCOUNTER — Inpatient Hospital Stay (HOSPITAL_COMMUNITY)
Admission: AD | Admit: 2014-01-25 | Discharge: 2014-01-26 | Disposition: A | Discharge status: Home / Self Care | Payer: BC Managed Care – PPO | Source: Ambulatory Visit | Attending: Obstetrics | Admitting: Obstetrics

## 2014-01-25 DIAGNOSIS — O479 False labor, unspecified: Secondary | ICD-10-CM | POA: Insufficient documentation

## 2014-01-25 DIAGNOSIS — M549 Dorsalgia, unspecified: Secondary | ICD-10-CM | POA: Insufficient documentation

## 2014-01-25 HISTORY — DX: Other specified health status: Z78.9

## 2014-01-25 HISTORY — DX: Unspecified abnormal cytological findings in specimens from vagina: R87.629

## 2014-01-25 LAB — OB RESULTS CONSOLE GBS: GBS: NEGATIVE

## 2014-01-26 ENCOUNTER — Encounter (HOSPITAL_COMMUNITY): Payer: Self-pay | Admitting: *Deleted

## 2014-01-26 LAB — POCT FERN TEST: POCT Fern Test: NEGATIVE

## 2014-01-26 NOTE — MAU Note (Signed)
Pt reports she had some trickling since 930 tonight and c/o back pain and ctx. Good fetal movement reported.

## 2014-01-30 ENCOUNTER — Encounter (HOSPITAL_COMMUNITY): Payer: Self-pay | Admitting: Anesthesiology

## 2014-01-30 ENCOUNTER — Inpatient Hospital Stay (HOSPITAL_COMMUNITY)
Admission: AD | Admit: 2014-01-30 | Discharge: 2014-02-01 | DRG: 774 | Disposition: A | Discharge status: Home / Self Care | Payer: BC Managed Care – PPO | Source: Ambulatory Visit | Attending: Obstetrics | Admitting: Obstetrics

## 2014-01-30 ENCOUNTER — Inpatient Hospital Stay (HOSPITAL_COMMUNITY)
Admission: AD | Admit: 2014-01-30 | Discharge: 2014-01-30 | Disposition: A | Discharge status: Home / Self Care | Payer: BC Managed Care – PPO | Source: Ambulatory Visit | Attending: Obstetrics | Admitting: Obstetrics

## 2014-01-30 ENCOUNTER — Encounter (HOSPITAL_COMMUNITY): Payer: Self-pay | Admitting: *Deleted

## 2014-01-30 DIAGNOSIS — IMO0001 Reserved for inherently not codable concepts without codable children: Secondary | ICD-10-CM

## 2014-01-30 DIAGNOSIS — O479 False labor, unspecified: Secondary | ICD-10-CM | POA: Diagnosis present

## 2014-01-30 LAB — TYPE AND SCREEN
ABO/RH(D): O POS
Antibody Screen: NEGATIVE

## 2014-01-30 LAB — CBC
HEMATOCRIT: 39.2 % (ref 36.0–46.0)
HEMOGLOBIN: 13.4 g/dL (ref 12.0–15.0)
MCH: 32 pg (ref 26.0–34.0)
MCHC: 34.2 g/dL (ref 30.0–36.0)
MCV: 93.6 fL (ref 78.0–100.0)
Platelets: 203 10*3/uL (ref 150–400)
RBC: 4.19 MIL/uL (ref 3.87–5.11)
RDW: 14.8 % (ref 11.5–15.5)
WBC: 14.2 10*3/uL — ABNORMAL HIGH (ref 4.0–10.5)

## 2014-01-30 MED ORDER — FENTANYL 2.5 MCG/ML BUPIVACAINE 1/10 % EPIDURAL INFUSION (WH - ANES)
INTRAMUSCULAR | Status: AC
  Filled 2014-01-30: qty 125

## 2014-01-30 MED ORDER — EPHEDRINE 5 MG/ML INJ
10.0000 mg | INTRAVENOUS | Status: DC | PRN
  Filled 2014-01-30: qty 2

## 2014-01-30 MED ORDER — OXYTOCIN 40 UNITS IN LACTATED RINGERS INFUSION - SIMPLE MED
999.0000 mL/h | Freq: Once | INTRAVENOUS | Status: AC
  Administered 2014-01-30: 999 mL/h via INTRAVENOUS

## 2014-01-30 MED ORDER — LIDOCAINE HCL (PF) 1 % IJ SOLN
30.0000 mL | INTRAMUSCULAR | Status: AC | PRN
  Administered 2014-01-30 (×2): 30 mL via SUBCUTANEOUS
  Filled 2014-01-30 (×2): qty 30

## 2014-01-30 MED ORDER — DIPHENHYDRAMINE HCL 50 MG/ML IJ SOLN: 12.5000 mg | INTRAMUSCULAR | Status: DC | PRN

## 2014-01-30 MED ORDER — PHENYLEPHRINE 40 MCG/ML (10ML) SYRINGE FOR IV PUSH (FOR BLOOD PRESSURE SUPPORT)
80.0000 ug | PREFILLED_SYRINGE | INTRAVENOUS | Status: DC | PRN
  Filled 2014-01-30: qty 2

## 2014-01-30 MED ORDER — IBUPROFEN 600 MG PO TABS
600.0000 mg | ORAL_TABLET | Freq: Four times a day (QID) | ORAL | Status: DC | PRN
  Administered 2014-01-30: 600 mg via ORAL
  Filled 2014-01-30: qty 1

## 2014-01-30 MED ORDER — PHENYLEPHRINE 40 MCG/ML (10ML) SYRINGE FOR IV PUSH (FOR BLOOD PRESSURE SUPPORT)
PREFILLED_SYRINGE | INTRAVENOUS | Status: AC
  Filled 2014-01-30: qty 5

## 2014-01-30 MED ORDER — FENTANYL 2.5 MCG/ML BUPIVACAINE 1/10 % EPIDURAL INFUSION (WH - ANES): 14.0000 mL/h | INTRAMUSCULAR | Status: DC | PRN

## 2014-01-30 MED ORDER — MISOPROSTOL 200 MCG PO TABS
ORAL_TABLET | ORAL | Status: AC
Start: 2014-01-30 — End: 2014-01-31
  Filled 2014-01-30: qty 4

## 2014-01-30 MED ORDER — CITRIC ACID-SODIUM CITRATE 334-500 MG/5ML PO SOLN: 30.0000 mL | ORAL | Status: DC | PRN

## 2014-01-30 MED ORDER — OXYTOCIN 40 UNITS IN LACTATED RINGERS INFUSION - SIMPLE MED
INTRAVENOUS | Status: AC
  Administered 2014-01-30: 40 [IU]
  Filled 2014-01-30: qty 1000

## 2014-01-30 MED ORDER — MISOPROSTOL 200 MCG PO TABS
800.0000 ug | ORAL_TABLET | Freq: Once | ORAL | Status: AC
  Administered 2014-01-30: 800 ug via RECTAL

## 2014-01-30 MED ORDER — OXYCODONE-ACETAMINOPHEN 5-325 MG PO TABS
2.0000 | ORAL_TABLET | Freq: Once | ORAL | Status: AC
  Administered 2014-01-30: 2 via ORAL
  Filled 2014-01-30: qty 2

## 2014-01-30 MED ORDER — LACTATED RINGERS IV SOLN: 500.0000 mL | INTRAVENOUS | Status: DC | PRN

## 2014-01-30 MED ORDER — LACTATED RINGERS IV SOLN
500.0000 mL | Freq: Once | INTRAVENOUS | Status: AC
  Administered 2014-01-30: 21:00:00 via INTRAVENOUS

## 2014-01-30 MED ORDER — OXYCODONE-ACETAMINOPHEN 5-325 MG PO TABS: 1.0000 | ORAL_TABLET | ORAL | Status: DC | PRN

## 2014-01-30 MED ORDER — ONDANSETRON HCL 4 MG/2ML IJ SOLN
4.0000 mg | Freq: Once | INTRAMUSCULAR | Status: AC
  Administered 2014-01-30: 4 mg via INTRAVENOUS
  Filled 2014-01-30: qty 2

## 2014-01-30 MED ORDER — ACETAMINOPHEN 325 MG PO TABS: 650.0000 mg | ORAL_TABLET | ORAL | Status: DC | PRN

## 2014-01-30 MED ORDER — OXYTOCIN 40 UNITS IN LACTATED RINGERS INFUSION - SIMPLE MED
62.5000 mL/h | INTRAVENOUS | Status: DC
  Administered 2014-01-30: 62.5 mL/h via INTRAVENOUS

## 2014-01-30 MED ORDER — OXYTOCIN 10 UNIT/ML IJ SOLN: 10.0000 [IU] | Freq: Once | INTRAMUSCULAR | Status: AC | PRN

## 2014-01-30 MED ORDER — PHENYLEPHRINE 40 MCG/ML (10ML) SYRINGE FOR IV PUSH (FOR BLOOD PRESSURE SUPPORT)
80.0000 ug | PREFILLED_SYRINGE | INTRAVENOUS | Status: DC | PRN
Start: 2014-01-30 — End: 2014-01-31
  Filled 2014-01-30: qty 2

## 2014-01-30 NOTE — MAU Note (Signed)
Contracting every 3min for the past 2 hrs. No bleeding or leaking. No problems with preg. Was 3/80/-2 earlier today.

## 2014-01-30 NOTE — Progress Notes (Addendum)
Patient ID: Casey Todd, female   DOB: Oct 16, 1980, 33 y.o.   MRN: [id number] Patient very anxious and requesting epidural.  Comfort measures offered and other alternatives for pain management discussed to help cope with pain while filling water tub for delivery.  Discussed the need to AROM to be able to assess fluid color before entry into water birth tub.  Patient insistent on getting epidural for pain management stating, "I don't think I could do this any longer without an epidural!"  IV and Epidural orders placed in EPIC. / 1 failed attempt at IV by RN / 2nd IV attempt by CNM with 18G catheter in LT forearm without difficulty - LR infusing at bolus rate.  [name], [name], M MSN, CNM 01/30/2014, 8:55 PM

## 2014-01-30 NOTE — Progress Notes (Signed)
Patient ID: Fara Borosmy M Hallmark, female   DOB: Jan 22, 1981, 33 y.o.   MRN: [id number]   Subjective: Jalexia Michell HeinrichM Commerford is a 33 y.o. G2P1001 at 2658w1d by ultrasound admitted for active labor and advanced dilation.  Objective: Filed Vitals:   01/30/14 2008  Temp: 98 F (36.7 C)  TempSrc: Oral  Height: 5\' 4"  (1.626 m)  Weight: 92.08 kg (203 lb)         FHT:  FHR: 140 bpm, variability: moderate,  accelerations:  Present,  decelerations:  Absent UC:   regular, every 2-4 minutes SVE:   Dilation: 7 Effacement (%): 90 Station: 0 Exam by:: CIT GroupDawson CNM  Labs: Results for orders placed during the hospital encounter of 01/30/14 (from the past 24 hour(s))  CBC     Status: Abnormal   Collection Time    01/30/14  8:25 PM      Result Value Ref Range   WBC 14.2 (*) 4.0 - 10.5 K/uL   RBC 4.19  3.87 - 5.11 MIL/uL   Hemoglobin 13.4  12.0 - 15.0 g/dL   HCT 40.939.2  81.136.0 - 91.446.0 %   MCV 93.6  78.0 - 100.0 fL   MCH 32.0  26.0 - 34.0 pg   MCHC 34.2  30.0 - 36.0 g/dL   RDW 78.214.8  95.611.5 - 21.315.5 %   Platelets 203  150 - 400 K/uL    Assessment / Plan: Spontaneous labor, progressing normally  Labor: Progressing normally Preeclampsia:  N/A Fetal Wellbeing:  Category I Pain Control:  Labor support without medications / requesting an epidural I/D:  n/a Anticipated MOD:  NSVD  Kenard GowerAWSON, Annison Birchard, M, MSN, CNM 01/30/2014, 8:43 PM

## 2014-01-30 NOTE — Anesthesia Preprocedure Evaluation (Deleted)
Anesthesia Evaluation  Patient identified by MRN, date of birth, ID band Patient awake    Reviewed: Allergy & Precautions, H&P , Patient's Chart, lab work & pertinent test results  Airway Mallampati: II TM Distance: >3 FB Neck ROM: full    Dental   Pulmonary  breath sounds clear to auscultation        Cardiovascular Rhythm:regular Rate:Normal     Neuro/Psych    GI/Hepatic   Endo/Other    Renal/GU      Musculoskeletal   Abdominal   Peds  Hematology   Anesthesia Other Findings   Reproductive/Obstetrics (+) Pregnancy                           Anesthesia Physical Anesthesia Plan  ASA: II  Anesthesia Plan: Epidural   Post-op Pain Management:    Induction:   Airway Management Planned:   Additional Equipment:   Intra-op Plan:   Post-operative Plan:   Informed Consent: I have reviewed the patients History and Physical, chart, labs and discussed the procedure including the risks, benefits and alternatives for the proposed anesthesia with the patient or authorized representative who has indicated his/her understanding and acceptance.     Plan Discussed with:   Anesthesia Plan Comments:        uanble to place secondary to baby coming out Anesthesia Quick Evaluation

## 2014-01-30 NOTE — MAU Note (Signed)
Patient brought from lobby directly to MAU room. Patient reports contractions every 2 minutes. Denies LOF or VB. +FM. Doppler FHR 143. SVE 7/90/-2 BBOW. Patient straight to room 161 for admit via stretcher.  Provider called and aware of patients status.

## 2014-01-30 NOTE — H&P (Signed)
OB ADMISSION/ HISTORY & PHYSICAL:  Admission Date: 01/30/2014  7:56 PM  Admit Diagnosis: Active Labor and Advanced Dilation  Casey Todd is a 33 y.o. female presenting for contractions every 5 minutes since being discharged from hospital.  Prenatal History: G2P1001   EDC : 01/29/2014, by ultrasound  Prenatal care at [facility] since 7.[redacted] weeks gestation Primary Care Provider at [facility]: Marlinda Mike, CNM  Prenatal course complicated by Abnormal glucose tolerance  Prenatal Labs: ABO, Rh:  O positive Antibody: NEG (08/14 2025) Rubella:  Immune RPR: Nonreactive (05/28 0000)  HBsAg:  Negative HIV: Non-reactive (12/31 0000)  GBS: Negative (08/09 0000)  1 hr Glucola : 135 mg/dL   Medical / Surgical History :  Past medical history:  Past Medical History  Diagnosis Date  . Medical history non-contributory   . Vaginal Pap smear, abnormal   . Postpartum care following vaginal delivery (8/14) 01/30/2014     Past surgical history:  Past Surgical History  Procedure Laterality Date  . Knee surgery    . Colposcopy vulva w/ biopsy    . Adenoidectomy and myringotomy with tube placement       Family History:  Family History  Problem Relation Age of Onset  . Alcohol abuse Neg Hx   . Arthritis Neg Hx   . Asthma Neg Hx   . Birth defects Neg Hx   . Cancer Neg Hx   . COPD Neg Hx   . Depression Neg Hx   . Diabetes Neg Hx   . Drug abuse Neg Hx   . Early death Neg Hx   . Hearing loss Neg Hx   . Heart disease Neg Hx   . Hyperlipidemia Neg Hx   . Hypertension Neg Hx   . Kidney disease Neg Hx   . Learning disabilities Neg Hx   . Mental illness Neg Hx   . Mental retardation Neg Hx   . Miscarriages / Stillbirths Neg Hx   . Stroke Neg Hx   . Vision loss Neg Hx   . Varicose Veins Neg Hx      Social History:  reports that she has never smoked. She does not have any smokeless tobacco history on file. She reports that she does not drink alcohol or use illicit  drugs.   Allergies: Other - severe N/V with narcotics   Current Medications at time of admission:  Prescriptions prior to admission  Medication Sig Dispense Refill  . calcium carbonate (TUMS - DOSED IN MG ELEMENTAL CALCIUM) 500 MG chewable tablet Chew 2 tablets by mouth daily as needed for heartburn.       . Prenatal Vit-Min-FA-Fish Oil ([employer] PRENATAL GUMMY PO) Take 1 each by mouth daily.          Review of Systems: Review of Systems  Constitutional: Negative.   HENT: Negative.   Eyes: Negative.   Cardiovascular: Negative.   Gastrointestinal: Negative.   Genitourinary: Negative.        Regular UC's every 5 mins  Musculoskeletal: Negative.   Skin: Negative.   Neurological: Negative.   Endo/Heme/Allergies: Negative.   Psychiatric/Behavioral: Negative.      Prenatal Transfer Tool  Maternal Diabetes: No Genetic Screening: Normal Maternal Ultrasounds/Referrals: Normal Fetal Ultrasounds or other Referrals:  None Maternal Substance Abuse:  No Significant Maternal Medications:  None Significant Maternal Lab Results:  None Other Comments:  None  Physical Exam: Dilation: 7 Effacement (%): 90 Station: 0 Exam by:: Arita Miss CNM VS: 137/85, 84, 22, 97  General: A&O x 3, increased anxiety and distress with contractions Heart: RRR, no murmurs Lungs: CTAB Abdomen: gravid, firm with contractions, normal bowel sounds Pelvic: **see PE above Extremities: atraumatic, normal ROM, no edema FHR: 140 / moderate variability / accels present / decels absent TOCO: regular every 5 mins  Labs:     Recent Labs  01/30/14 2025  WBC 14.2*  HGB 13.4  HCT 39.2  PLT 203     Assessment:  33 y.o. G2P1001 at 7551w1d  1. Labor: active 2. Fetal Wellbeing: Category 1  3. Pain Control: none 4. GBS: neagtive 5. Elevated Blood Pressure  Plan:  1. Admit to BS 2. Routine L&D orders 3. Analgesia/anesthesia PRN     Dr Ernestina PennaFogleman notified of admission / plan of care    Kenard GowerDAWSON, Maudine Kluesner, M,  MSN, CNM 01/30/2014, 11:24 PM

## 2014-01-30 NOTE — Discharge Instructions (Signed)
Braxton Hicks Contractions °Contractions of the uterus can occur throughout pregnancy. Contractions are not always a sign that you are in labor.  °WHAT ARE BRAXTON HICKS CONTRACTIONS?  °Contractions that occur before labor are called Braxton Hicks contractions, or false labor. Toward the end of pregnancy (32-34 weeks), these contractions can develop more often and may become more forceful. This is not true labor because these contractions do not result in opening (dilatation) and thinning of the cervix. They are sometimes difficult to tell apart from true labor because these contractions can be forceful and people have different pain tolerances. You should not feel embarrassed if you go to the hospital with false labor. Sometimes, the only way to tell if you are in true labor is for your health care provider to look for changes in the cervix. °If there are no prenatal problems or other health problems associated with the pregnancy, it is completely safe to be sent home with false labor and await the onset of true labor. °HOW CAN YOU TELL THE DIFFERENCE BETWEEN TRUE AND FALSE LABOR? °False Labor °· The contractions of false labor are usually shorter and not as hard as those of true labor.   °· The contractions are usually irregular.   °· The contractions are often felt in the front of the lower abdomen and in the groin.   °· The contractions may go away when you walk around or change positions while lying down.   °· The contractions get weaker and are shorter lasting as time goes on.   °· The contractions do not usually become progressively stronger, regular, and closer together as with true labor.   °True Labor °· Contractions in true labor last 30-70 seconds, become very regular, usually become more intense, and increase in frequency.   °· The contractions do not go away with walking.   °· The discomfort is usually felt in the top of the uterus and spreads to the lower abdomen and low back.   °· True labor can be  determined by your health care provider with an exam. This will show that the cervix is dilating and getting thinner.   °WHAT TO REMEMBER °· Keep up with your usual exercises and follow other instructions given by your health care provider.   °· Take medicines as directed by your health care provider.   °· Keep your regular prenatal appointments.   °· Eat and drink lightly if you think you are going into labor.   °· If Braxton Hicks contractions are making you uncomfortable:   °¨ Change your position from lying down or resting to walking, or from walking to resting.   °¨ Sit and rest in a tub of warm water.   °¨ Drink 2-3 glasses of water. Dehydration may cause these contractions.   °¨ Do slow and deep breathing several times an hour.   °WHEN SHOULD I SEEK IMMEDIATE MEDICAL CARE? °Seek immediate medical care if: °· Your contractions become stronger, more regular, and closer together.   °· You have fluid leaking or gushing from your vagina.   °· You have a fever.   °· You pass blood-tinged mucus.   °· You have vaginal bleeding.   °· You have continuous abdominal pain.   °· You have low back pain that you never had before.   °· You feel your baby's head pushing down and causing pelvic pressure.   °· Your baby is not moving as much as it used to.   °Document Released: 06/05/2005 Document Revised: 06/10/2013 Document Reviewed: 03/17/2013 °Exit[employer]® Patient Information ©2015 [employer]. This information is not intended to replace advice given to you by your health care   provider. Make sure you discuss any questions you have with your health care provider. ° °

## 2014-01-30 NOTE — Progress Notes (Signed)
Patient ID: Casey Todd, female   DOB: [birth year], 33 y.o.   MRN: [id number] Called to Room by RN to assess brisk, moderate bleeding.  Small slow trickle of dark, red blood noted only with fundal massage.  No other bleeding management needed at this time.  Observe closely.  Orders for methergine already added to postpartum orders. Plan: If bleeding increases again, given methergine and insert indwelling Foley  *Dr. Ernestina PennaFogleman notified of status - agrees with plan  Kenard GowerAWSON, Ion Gonnella, M MSN, CNM 01/30/2014 11:08 PM

## 2014-01-31 LAB — COMPREHENSIVE METABOLIC PANEL
ALK PHOS: 134 U/L — AB (ref 39–117)
ALT: 9 U/L (ref 0–35)
AST: 15 U/L (ref 0–37)
Albumin: 2.4 g/dL — ABNORMAL LOW (ref 3.5–5.2)
Anion gap: 9 (ref 5–15)
BILIRUBIN TOTAL: 0.2 mg/dL — AB (ref 0.3–1.2)
BUN: 10 mg/dL (ref 6–23)
CHLORIDE: 105 meq/L (ref 96–112)
CO2: 25 mEq/L (ref 19–32)
Calcium: 8.8 mg/dL (ref 8.4–10.5)
Creatinine, Ser: 0.74 mg/dL (ref 0.50–1.10)
GFR calc non Af Amer: >90 mL/min (ref >90)
GLUCOSE: 82 mg/dL (ref 70–99)
POTASSIUM: 4.4 meq/L (ref 3.7–5.3)
Sodium: 139 mEq/L (ref 137–147)
Total Protein: 5 g/dL — ABNORMAL LOW (ref 6.0–8.3)

## 2014-01-31 LAB — CBC
HCT: 34 % — ABNORMAL LOW (ref 36.0–46.0)
Hemoglobin: 11.5 g/dL — ABNORMAL LOW (ref 12.0–15.0)
MCH: 32 pg (ref 26.0–34.0)
MCHC: 33.8 g/dL (ref 30.0–36.0)
MCV: 94.7 fL (ref 78.0–100.0)
PLATELETS: 188 10*3/uL (ref 150–400)
RBC: 3.59 MIL/uL — AB (ref 3.87–5.11)
RDW: 14.8 % (ref 11.5–15.5)
WBC: 15.8 10*3/uL — ABNORMAL HIGH (ref 4.0–10.5)

## 2014-01-31 LAB — RPR

## 2014-01-31 LAB — URIC ACID: Uric Acid, Serum: 4.4 mg/dL (ref 2.4–7.0)

## 2014-01-31 MED ORDER — TETANUS-DIPHTH-ACELL PERTUSSIS 5-2.5-18.5 LF-MCG/0.5 IM SUSP: 0.5000 mL | Freq: Once | INTRAMUSCULAR | Status: DC

## 2014-01-31 MED ORDER — ONDANSETRON HCL 4 MG PO TABS: 4.0000 mg | ORAL_TABLET | ORAL | Status: DC | PRN

## 2014-01-31 MED ORDER — ONDANSETRON HCL 4 MG/2ML IJ SOLN: 4.0000 mg | INTRAMUSCULAR | Status: DC | PRN

## 2014-01-31 MED ORDER — OXYTOCIN 40 UNITS IN LACTATED RINGERS INFUSION - SIMPLE MED: 62.5000 mL/h | INTRAVENOUS | Status: DC | PRN

## 2014-01-31 MED ORDER — OXYCODONE-ACETAMINOPHEN 5-325 MG PO TABS
1.0000 | ORAL_TABLET | ORAL | Status: DC | PRN
  Administered 2014-01-31 (×2): 1 via ORAL
  Administered 2014-01-31: 2 via ORAL
  Administered 2014-02-01: 1 via ORAL
  Filled 2014-01-31: qty 2
  Filled 2014-01-31 (×3): qty 1

## 2014-01-31 MED ORDER — WITCH HAZEL-GLYCERIN EX PADS: 1.0000 "application " | MEDICATED_PAD | CUTANEOUS | Status: DC | PRN

## 2014-01-31 MED ORDER — SENNOSIDES-DOCUSATE SODIUM 8.6-50 MG PO TABS
2.0000 | ORAL_TABLET | ORAL | Status: DC
  Administered 2014-01-31 (×2): 2 via ORAL
  Filled 2014-01-31 (×2): qty 2

## 2014-01-31 MED ORDER — BENZOCAINE-MENTHOL 20-0.5 % EX AERO
1.0000 "application " | INHALATION_SPRAY | CUTANEOUS | Status: DC | PRN
  Administered 2014-01-31: 1 via TOPICAL
  Filled 2014-01-31: qty 56

## 2014-01-31 MED ORDER — SIMETHICONE 80 MG PO CHEW: 80.0000 mg | CHEWABLE_TABLET | ORAL | Status: DC | PRN

## 2014-01-31 MED ORDER — DIBUCAINE 1 % RE OINT: 1.0000 "application " | TOPICAL_OINTMENT | RECTAL | Status: DC | PRN

## 2014-01-31 MED ORDER — METHYLERGONOVINE MALEATE 0.2 MG PO TABS: 0.2000 mg | ORAL_TABLET | ORAL | Status: DC | PRN

## 2014-01-31 MED ORDER — LACTATED RINGERS IV SOLN: INTRAVENOUS | Status: DC

## 2014-01-31 MED ORDER — PRENATAL MULTIVITAMIN CH
1.0000 | ORAL_TABLET | Freq: Every day | ORAL | Status: DC
  Administered 2014-01-31 – 2014-02-01 (×2): 1 via ORAL
  Filled 2014-01-31 (×2): qty 1

## 2014-01-31 MED ORDER — METHYLERGONOVINE MALEATE 0.2 MG/ML IJ SOLN: 0.2000 mg | INTRAMUSCULAR | Status: DC | PRN

## 2014-01-31 MED ORDER — FERROUS SULFATE 325 (65 FE) MG PO TABS
325.0000 mg | ORAL_TABLET | Freq: Two times a day (BID) | ORAL | Status: DC
  Administered 2014-01-31 – 2014-02-01 (×3): 325 mg via ORAL
  Filled 2014-01-31 (×3): qty 1

## 2014-01-31 MED ORDER — IBUPROFEN 600 MG PO TABS
600.0000 mg | ORAL_TABLET | Freq: Four times a day (QID) | ORAL | Status: DC
  Administered 2014-01-31 – 2014-02-01 (×6): 600 mg via ORAL
  Filled 2014-01-31 (×6): qty 1

## 2014-01-31 MED ORDER — DIPHENHYDRAMINE HCL 25 MG PO CAPS: 25.0000 mg | ORAL_CAPSULE | Freq: Four times a day (QID) | ORAL | Status: DC | PRN

## 2014-01-31 NOTE — Progress Notes (Signed)
Patient ID: Casey Todd, female   DOB: 1981/05/04, 33 y.o.   MRN: [id number] PPD # 1 SVD  S:  Reports feeling well             Tolerating po/ No nausea or vomiting             Bleeding is light             Pain controlled with ibuprofen (OTC)             Up ad lib / ambulatory / voiding without difficulties    Newborn  Information for the patient's newborn:  Casey Todd, Casey Todd [[id number]]  female  breast feeding  / Circumcision ?Planning d/t to hypospadias   O:  A & O x 3, in no apparent distress              VS:  Filed Vitals:   01/30/14 2326 01/31/14 0040 01/31/14 0145 01/31/14 0545  BP: 141/79 137/87 129/76 121/70  Pulse: 87 91 77 75  Temp:  98.5 F (36.9 C) 98.4 F (36.9 C) 98 F (36.7 C)  TempSrc:  Oral Oral Oral  Resp:  18 18 18   Height:      Weight:      SpO2:  100%  98%    LABS:  Recent Labs  01/30/14 2025 01/31/14 0610  WBC 14.2* 15.8*  HGB 13.4 11.5*  HCT 39.2 34.0*  PLT 203 188    Blood type: --/--/O POS (08/14 2025)  Rubella:     I&O: I/O last 3 completed shifts: In: -  Out: 400 [Blood:400]             Lungs: Clear and unlabored  Heart: regular rate and rhythm / no murmurs  Abdomen: soft, non-tender, non-distended              Fundus: firm, non-tender, U-1  Perineum: 2nd degree repair healing well  Lochia: minimal  Extremities: No edema, no calf pain or tenderness, No Homans    A/P: PPD # 1  33 y.o., [id number]   Principal Problem:    Postpartum care following vaginal delivery (8/14)    Doing well - stable status  Routine post partum orders  Anticipate discharge tomorrow    [name], Casey Todd, M, MSN, CNM 01/31/2014, 9:18 AM

## 2014-01-31 NOTE — Lactation Note (Addendum)
This note was copied from the chart of Casey Todd. Lactation Consultation Note  P2.  Mother stated her first baby (now 9 months) was 37 weeks and she had to post pump and supplement. Mother states during her pregnancy, he milk supply dropped so she weaned 419 month old recently. Baby has breastfed x 9, 2 voids, 2 stools since birth. Mother had infant in football hold, sucks and swallows observed.   Demonstrated how to rotate baby so he breastfeeds tummy to tummy. Mother states she knows how to hand express. Reviewed late preterm feeding behavior and how this baby is different.  Mother needed encouragement. Mom encouraged to feed baby 8-12 times/24 hours and with feeding cues.  Mom made aware of O/P services, breastfeeding support groups, community resources, and our phone # for post-discharge questions.     Patient Name: Casey Pearla Dubonnetmy Stieber ONGEX'BToday's Date: 01/31/2014 Reason for consult: Initial assessment   Maternal Data Does the patient have breastfeeding experience prior to this delivery?: Yes  Feeding Feeding Type: Breast Fed Length of feed: 15 min  LATCH Score/Interventions                      Lactation Tools Discussed/Used     Consult Status Consult Status: Follow-up Date: 02/01/14 Follow-up type: In-patient    Dahlia ByesBerkelhammer, Ruth [facility] 01/31/2014, 2:39 PM

## 2014-02-01 MED ORDER — FERROUS SULFATE 325 (65 FE) MG PO TABS
325.0000 mg | ORAL_TABLET | Freq: Two times a day (BID) | ORAL | Status: AC
Start: 2014-02-01 — End: ?

## 2014-02-01 MED ORDER — IBUPROFEN 600 MG PO TABS: 600.0000 mg | ORAL_TABLET | Freq: Four times a day (QID) | ORAL | Status: AC

## 2014-02-01 MED ORDER — OXYCODONE-ACETAMINOPHEN 5-325 MG PO TABS: 1.0000 | ORAL_TABLET | ORAL | Status: AC | PRN

## 2014-02-01 NOTE — Discharge Instructions (Signed)
Nutrition for the New Mother  °A new mother needs good health and nutrition so she can have energy to take care of a new baby. Whether a mother breastfeeds or formula feeds the baby, it is important to have a well-balanced diet. Foods from all the food groups should be chosen to meet the new mother's energy needs and to give her the nutrients needed for repair and healing.  °A HEALTHY EATING PLAN °The My Pyramid plan for Moms outlines what you should eat to help you and your baby stay healthy. The energy and amount of food you need depends on whether or not you are breastfeeding. If you are breastfeeding you will need more nutrients. If you choose not to breastfeed, your nutrition goal should be to return to a healthy weight. Limiting calories may be needed if you are not breastfeeding.  °HOME CARE INSTRUCTIONS  °· For a personal plan based on your unique needs, see your Registered Dietitian or visit www.mypyramid.gov. °· Eat a variety of foods. The plan below will help guide you. The following chart has a suggested daily meal plan from the My Pyramid for Moms. °· Eat a variety of fruits and vegetables. °· Eat more dark green and orange vegetables and cooked dried beans. °· Make half your grains whole grains. Choose whole instead of refined grains. °· Choose low-fat or lean meats and poultry. °· Choose low-fat or fat-free dairy products like milk, cheese, or yogurt. °Fruits °· Breastfeeding: 2 cups °· Non-Breastfeeding: 2 cups °· What Counts as a serving? °¨ 1 cup of fruit or juice. °¨ ½ cup dried fruit. °Vegetables °· Breastfeeding: 3 cups °· Non-Breastfeeding: 2 ½ cups °· What Counts as a serving? °¨ 1 cup raw or cooked vegetables. °¨ Juice or 2 cups raw leafy vegetables. °Grains °· Breastfeeding: 8 oz °· Non-Breastfeeding: 6 oz °· What Counts as a serving? °¨ 1 slice bread. °¨ 1 oz ready-to-eat cereal. °¨ ½ cup cooked pasta, rice, or cereal. °Meat and Beans °· Breastfeeding: 6 ½ oz °· Non-Breastfeeding: 5 ½  oz °· What Counts as a serving? °¨ 1 oz lean meat, poultry, or fish °¨ ¼ cup cooked dry beans °¨ ½ oz nuts or 1 egg °¨ 1 tbs peanut butter °Milk °· Breastfeeding: 3 cups °· Non-Breastfeeding: 3 cups °· What Counts as a serving? °¨ 1 cup milk. °¨ 8 oz yogurt. °¨ 1 ½ oz cheese. °¨ 2 oz processed cheese. °TIPS FOR THE BREASTFEEDING MOM °· Rapid weight loss is not suggested when you are breastfeeding. By simply breastfeeding, you will be able to lose the weight gained during your pregnancy. Your caregiver can keep track of your weight and tell you if your weight loss is appropriate. °· Be sure to drink fluids. You may notice that you are thirstier than usual. A suggestion is to drink a glass of water or other beverage whenever you breastfeed. °· Avoid alcohol as it can be passed into your breast milk. °· Limit caffeine drinks to no more than 2 to 3 cups per day. °· You may need to keep taking your prenatal vitamin while you are breastfeeding. Talk with your caregiver about taking a vitamin or supplement. °RETURING TO A HEALTHY WEIGHT °· The My Pyramid Plan for Moms will help you return to a healthy weight. It will also provide the nutrients you need. °· You may need to limit "empty" calories. These include: °¨ High fat foods like fried foods, fatty meats, fast food, butter, and mayonnaise. °¨ High   sugar foods like sodas, jelly, candy, and sweets.  Be physically active. Include 30 minutes of exercise or more each day. Choose an activity you like such as walking, swimming, biking, or aerobics. Check with your caregiver before you start to exercise. Document Released: 09/12/2007 Document Revised: 08/28/2011 Document Reviewed: 09/12/2007 [facility] Patient Information 2015 [place], [place]. This information is not intended to replace advice given to you by your health care provider. Make sure you discuss any questions you have with your health care provider.  Breast Pumping Tips If you are breastfeeding, there may be  times when you cannot feed your baby directly. Returning to work or going on a trip are common examples. Pumping allows you to store breast milk and feed it to your baby later.  You may not get much milk when you first start to pump. Your breasts should start to make more after a few days. If you pump at the times you usually feed your baby, you may be able to keep making enough milk to feed your baby without also using formula. The more often you pump, the more milk you will produce. WHEN SHOULD I PUMP?   You can begin to pump soon after delivery. However, some experts recommend waiting about 4 weeks before giving your infant a bottle to make sure breastfeeding is going well.  If you plan to return to work, begin pumping a few weeks before. This will help you develop techniques that work best for you. It also lets you build up a supply of breast milk.   When you are with your infant, feed on demand and pump after each feeding.   When you are away from your infant for several hours, pump for about 15 minutes every 2-3 hours. Pump both breasts at the same time if you can.   If your infant has a formula feeding, make sure to pump around the same time.   If you drink any alcohol, wait 2 hours before pumping.  HOW DO I PREPARE TO PUMP? Your let-down reflexis the natural reaction to stimulation that makes your breast milk flow. It is easier to stimulate this reflex when you are relaxed. Find relaxation techniques that work for you. If you have difficulty with your let-down reflex, try these methods:   Smell one of your infant's blankets or an item of clothing.   Look at a picture or video of your infant.   Sit in a quiet, private space.   Massage the breast you plan to pump.   Place soothing warmth on the breast.   Play relaxing music.  WHAT ARE SOME GENERAL BREAST PUMPING TIPS?  Wash your hands before you pump. You do not need to wash your nipples or breasts.  There are  three ways to pump.  You can use your hand to massage and compress your breast.  You can use a handheld manual pump.  You can use an electric pump.   Make sure the suction cup (flange) on the breast pump is the right size. Place the flange directly over the nipple. If it is the wrong size or placed the wrong way, it may be painful and cause nipple damage.   If pumping is uncomfortable, apply a small amount of purified or modified lanolin to your nipple and areola.  If you are using an electric pump, adjust the speed and suction power to be more comfortable.  If pumping is painful or if you are not getting very much milk, you may  need a different type of pump. A lactation consultant can help you determine what type of pump to use.   Keep a full water bottle near you at all times. Drinking lots of fluid helps you make more milk.  You can store your milk to use later. Pumped breast milk can be stored in a sealable, sterile container or plastic bag. Label all stored breast milk with the date you pumped it.  Milk can stay out at room temperature for up to 8 hours.  You can store your milk in the refrigerator for up to 8 days.  You can store your milk in the freezer for 3 months. Thaw frozen milk using warm water. Do not put it in the microwave.  Do not smoke. Smoking can lower your milk supply and harm your infant. If you need help quitting, ask your health care provider to recommend a program.  WHEN SHOULD I CALL MY HEALTH CARE PROVIDER OR A LACTATION CONSULTANT?  You are having trouble pumping.  You are concerned that you are not making enough milk.  You have nipple pain, soreness, or redness.  You want to use birth control. Birth control pills may lower your milk supply. Talk to your health care provider about your options. Document Released: 11/23/2009 Document Revised: 06/10/2013 Document Reviewed: 03/28/2013 [facility] Patient Information 2015 [place]. This  information is not intended to replace advice given to you by your health care provider. Make sure you discuss any questions you have with your health care provider. Postpartum Depression and Baby Blues The postpartum period begins right after the birth of a baby. During this time, there is often a great amount of joy and excitement. It is also a time of many changes in the life of the parents. Regardless of how many times a mother gives birth, each child brings new challenges and dynamics to the family. It is not unusual to have feelings of excitement along with confusing shifts in moods, emotions, and thoughts. All mothers are at risk of developing postpartum depression or the "baby blues." These mood changes can occur right after giving birth, or they may occur many months after giving birth. The baby blues or postpartum depression can be mild or severe. Additionally, postpartum depression can go away rather quickly, or it can be a long-term condition.  CAUSES Raised hormone levels and the rapid drop in those levels are thought to be a main cause of postpartum depression and the baby blues. A number of hormones change during and after pregnancy. Estrogen and progesterone usually decrease right after the delivery of your baby. The levels of thyroid hormone and various cortisol steroids also rapidly drop. Other factors that play a role in these mood changes include major life events and genetics.  RISK FACTORS If you have any of the following risks for the baby blues or postpartum depression, know what symptoms to watch out for during the postpartum period. Risk factors that may increase the likelihood of getting the baby blues or postpartum depression include:  Having a personal or family history of depression.   Having depression while being pregnant.   Having premenstrual mood issues or mood issues related to oral contraceptives.  Having a lot of life stress.   Having marital conflict.    Lacking a social support network.   Having a baby with special needs.   Having health problems, such as diabetes.  SIGNS AND SYMPTOMS Symptoms of baby blues include:  Brief changes in mood, such as going  from extreme happiness to sadness.  Decreased concentration.   Difficulty sleeping.   Crying spells, tearfulness.   Irritability.   Anxiety.  Symptoms of postpartum depression typically begin within the first month after giving birth. These symptoms include:  Difficulty sleeping or excessive sleepiness.   Marked weight loss.   Agitation.   Feelings of worthlessness.   Lack of interest in activity or food.  Postpartum psychosis is a very serious condition and can be dangerous. Fortunately, it is rare. Displaying any of the following symptoms is cause for immediate medical attention. Symptoms of postpartum psychosis include:   Hallucinations and delusions.   Bizarre or disorganized behavior.   Confusion or disorientation.  DIAGNOSIS  A diagnosis is made by an evaluation of your symptoms. There are no medical or lab tests that lead to a diagnosis, but there are various questionnaires that a health care provider may use to identify those with the baby blues, postpartum depression, or psychosis. Often, a screening tool called the [place] Postnatal Depression Scale is used to diagnose depression in the postpartum period.  TREATMENT The baby blues usually goes away on its own in 1-2 weeks. Social support is often all that is needed. You will be encouraged to get adequate sleep and rest. Occasionally, you may be given medicines to help you sleep.  Postpartum depression requires treatment because it can last several months or longer if it is not treated. Treatment may include individual or group therapy, medicine, or both to address any social, physiological, and psychological factors that may play a role in the depression. Regular exercise, a healthy diet,  rest, and social support may also be strongly recommended.  Postpartum psychosis is more serious and needs treatment right away. Hospitalization is often needed. HOME CARE INSTRUCTIONS  Get as much rest as you can. Nap when the baby sleeps.   Exercise regularly. Some women find yoga and walking to be beneficial.   Eat a balanced and nourishing diet.   Do little things that you enjoy. Have a cup of tea, take a bubble bath, read your favorite magazine, or listen to your favorite music.  Avoid alcohol.   Ask for help with household chores, cooking, grocery shopping, or running errands as needed. Do not try to do everything.   Talk to people close to you about how you are feeling. Get support from your partner, family members, friends, or other new moms.  Try to stay positive in how you think. Think about the things you are grateful for.   Do not spend a lot of time alone.   Only take over-the-counter or prescription medicine as directed by your health care provider.  Keep all your postpartum appointments.   Let your health care provider know if you have any concerns.  SEEK MEDICAL CARE IF: You are having a reaction to or problems with your medicine. SEEK IMMEDIATE MEDICAL CARE IF:  You have suicidal feelings.   You think you may harm the baby or someone else. MAKE SURE YOU:  Understand these instructions.  Will watch your condition.  Will get help right away if you are not doing well or get worse. Document Released: 03/09/2004 Document Revised: 06/10/2013 Document Reviewed: 03/17/2013 [facility] Patient Information 2015 [place], [place]. This information is not intended to replace advice given to you by your health care provider. Make sure you discuss any questions you have with your health care provider. Breastfeeding and Mastitis Mastitis is inflammation of the breast tissue. It can occur in women who  are breastfeeding. This can make breastfeeding painful. Mastitis  will sometimes go away on its own. Your health care provider will help determine if treatment is needed. CAUSES Mastitis is often associated with a blocked milk (lactiferous) duct. This can happen when too much milk builds up in the breast. Causes of excess milk in the breast can include:  Poor latch-on. If your baby is not latched onto the breast properly, she or he may not empty your breast completely while breastfeeding.  Allowing too much time to pass between feedings.  Wearing a bra or other clothing that is too tight. This puts extra pressure on the lactiferous ducts so milk does not flow through them as it should. Mastitis can also be caused by a bacterial infection. Bacteria may enter the breast tissue through cuts or openings in the skin. In women who are breastfeeding, this may occur because of cracked or irritated skin. Cracks in the skin are often caused when your baby does not latch on properly to the breast. SIGNS AND SYMPTOMS  Swelling, redness, tenderness, and pain in an area of the breast.  Swelling of the glands under the arm on the same side.  Fever may or may not accompany mastitis. If an infection is allowed to progress, a collection of pus (abscess) may develop. DIAGNOSIS  Your health care provider can usually diagnose mastitis based on your symptoms and a physical exam. Tests may be done to help confirm the diagnosis. These may include:  Removal of pus from the breast by applying pressure to the area. This pus can be examined in the lab to determine which bacteria are present. If an abscess has developed, the fluid in the abscess can be removed with a needle. This can also be used to confirm the diagnosis and determine the bacteria present. In most cases, pus will not be present.  Blood tests to determine if your body is fighting a bacterial infection.  Mammogram or ultrasound tests to rule out other problems or diseases. TREATMENT  Mastitis that occurs with  breastfeeding will sometimes go away on its own. Your health care provider may choose to wait 24 hours after first seeing you to decide whether a prescription medicine is needed. If your symptoms are worse after 24 hours, your health care provider will likely prescribe an antibiotic medicine to treat the mastitis. He or she will determine which bacteria are most likely causing the infection and will then select an appropriate antibiotic medicine. This is sometimes changed based on the results of tests performed to identify the bacteria, or if there is no response to the antibiotic medicine selected. Antibiotic medicines are usually given by mouth. You may also be given medicine for pain. HOME CARE INSTRUCTIONS  Only take over-the-counter or prescription medicines for pain, fever, or discomfort as directed by your health care provider.  If your health care provider prescribed an antibiotic medicine, take the medicine as directed. Make sure you finish it even if you start to feel better.  Do not wear a tight or underwire bra. Wear a soft, supportive bra.  Increase your fluid intake, especially if you have a fever.  Continue to empty the breast. Your health care provider can tell you whether this milk is safe for your infant or needs to be thrown out. You may be told to stop nursing until your health care provider thinks it is safe for your baby. Use a breast pump if you are advised to stop nursing.  Keep your nipples  clean and dry.  Empty the first breast completely before going to the other breast. If your baby is not emptying your breasts completely for some reason, use a breast pump to empty your breasts.  If you go back to work, pump your breasts while at work to stay in time with your nursing schedule.  Avoid allowing your breasts to become overly filled with milk (engorged). SEEK MEDICAL CARE IF:  You have pus-like discharge from the breast.  Your symptoms do not improve with the  treatment prescribed by your health care provider within 2 days. SEEK IMMEDIATE MEDICAL CARE IF:  Your pain and swelling are getting worse.  You have pain that is not controlled with medicine.  You have a red line extending from the breast toward your armpit.  You have a fever or persistent symptoms for more than 2-3 days.  You have a fever and your symptoms suddenly get worse. MAKE SURE YOU:   Understand these instructions.  Will watch your condition.  Will get help right away if you are not doing well or get worse. Document Released: 09/30/2004 Document Revised: 06/10/2013 Document Reviewed: 01/09/2013 [facility] Patient Information 2015 [place], [place]. This information is not intended to replace advice given to you by your health care provider. Make sure you discuss any questions you have with your health care provider. Breastfeeding Deciding to breastfeed is one of the best choices you can make for you and your baby. A change in hormones during pregnancy causes your breast tissue to grow and increases the number and size of your milk ducts. These hormones also allow proteins, sugars, and fats from your blood supply to make breast milk in your milk-producing glands. Hormones prevent breast milk from being released before your baby is born as well as prompt milk flow after birth. Once breastfeeding has begun, thoughts of your baby, as well as his or her sucking or crying, can stimulate the release of milk from your milk-producing glands.  BENEFITS OF BREASTFEEDING For Your Baby  Your first milk (colostrum) helps your baby's digestive system function better.   There are antibodies in your milk that help your baby fight off infections.   Your baby has a lower incidence of asthma, allergies, and sudden infant death syndrome.   The nutrients in breast milk are better for your baby than infant formulas and are designed uniquely for your baby's needs.   Breast milk improves your  baby's brain development.   Your baby is less likely to develop other conditions, such as childhood obesity, asthma, or type 2 diabetes mellitus.  For You   Breastfeeding helps to create a very special bond between you and your baby.   Breastfeeding is convenient. Breast milk is always available at the correct temperature and costs nothing.   Breastfeeding helps to burn calories and helps you lose the weight gained during pregnancy.   Breastfeeding makes your uterus contract to its prepregnancy size faster and slows bleeding (lochia) after you give birth.   Breastfeeding helps to lower your risk of developing type 2 diabetes mellitus, osteoporosis, and breast or ovarian cancer later in life. SIGNS THAT YOUR BABY IS HUNGRY Early Signs of Hunger  Increased alertness or activity.  Stretching.  Movement of the head from side to side.  Movement of the head and opening of the mouth when the corner of the mouth or cheek is stroked (rooting).  Increased sucking sounds, smacking lips, cooing, sighing, or squeaking.  Hand-to-mouth movements.  Increased sucking of  fingers or hands. °Late Signs of Hunger °· Fussing. °· Intermittent crying. °Extreme Signs of Hunger °Signs of extreme hunger will require calming and consoling before your baby will be able to breastfeed successfully. Do not wait for the following signs of extreme hunger to occur before you initiate breastfeeding:   °· Restlessness. °· A loud, strong cry. °·  Screaming. °BREASTFEEDING BASICS °Breastfeeding Initiation °· Find a comfortable place to sit or lie down, with your neck and back well supported. °· Place a pillow or rolled up blanket under your baby to bring him or her to the level of your breast (if you are seated). Nursing pillows are specially designed to help support your arms and your baby while you breastfeed. °· Make sure that your baby's abdomen is facing your abdomen.   °· Gently massage your breast. With your  fingertips, massage from your chest wall toward your nipple in a circular motion. This encourages milk flow. You may need to continue this action during the feeding if your milk flows slowly. °· Support your breast with 4 fingers underneath and your thumb above your nipple. Make sure your fingers are well away from your nipple and your baby's mouth.   °· Stroke your baby's lips gently with your finger or nipple.   °· When your baby's mouth is open wide enough, quickly bring your baby to your breast, placing your entire nipple and as much of the colored area around your nipple (areola) as possible into your baby's mouth.   °¨ More areola should be visible above your baby's upper lip than below the lower lip.   °¨ Your baby's tongue should be between his or her lower gum and your breast.   °· Ensure that your baby's mouth is correctly positioned around your nipple (latched). Your baby's lips should create a seal on your breast and be turned out (everted). °· It is common for your baby to suck about 2-3 minutes in order to start the flow of breast milk. °Latching °Teaching your baby how to latch on to your breast properly is very important. An improper latch can cause nipple pain and decreased milk supply for you and poor weight gain in your baby. Also, if your baby is not latched onto your nipple properly, he or she may swallow some air during feeding. This can make your baby fussy. Burping your baby when you switch breasts during the feeding can help to get rid of the air. However, teaching your baby to latch on properly is still the best way to prevent fussiness from swallowing air while breastfeeding. °Signs that your baby has successfully latched on to your nipple:    °· Silent tugging or silent sucking, without causing you pain.   °· Swallowing heard between every 3-4 sucks.   °·  Muscle movement above and in front of his or her ears while sucking.   °Signs that your baby has not successfully latched on to  nipple:  °· Sucking sounds or smacking sounds from your baby while breastfeeding. °· Nipple pain. °If you think your baby has not latched on correctly, slip your finger into the corner of your baby's mouth to break the suction and place it between your baby's gums. Attempt breastfeeding initiation again. °Signs of Successful Breastfeeding °Signs from your baby:   °· A gradual decrease in the number of sucks or complete cessation of sucking.   °· Falling asleep.   °· Relaxation of his or her body.   °· Retention of a small amount of milk in his or her mouth.   °· Letting go   of your breast by himself or herself. °Signs from you: °· Breasts that have increased in firmness, weight, and size 1-3 hours after feeding.   °· Breasts that are softer immediately after breastfeeding. °· Increased milk volume, as well as a change in milk consistency and color by the fifth day of breastfeeding.   °· Nipples that are not sore, cracked, or bleeding. °Signs That Your Baby is Getting Enough Milk °· Wetting at least 3 diapers in a 24-hour period. The urine should be clear and pale yellow by age 5 days. °· At least 3 stools in a 24-hour period by age 5 days. The stool should be soft and yellow. °· At least 3 stools in a 24-hour period by age 7 days. The stool should be seedy and yellow. °· No loss of weight greater than 10% of birth weight during the first 3 days of age. °· Average weight gain of 4-7 ounces (113-198 g) per week after age 4 days. °· Consistent daily weight gain by age 5 days, without weight loss after the age of 2 weeks. °After a feeding, your baby may spit up a small amount. This is common. °BREASTFEEDING FREQUENCY AND DURATION °Frequent feeding will help you make more milk and can prevent sore nipples and breast engorgement. Breastfeed when you feel the need to reduce the fullness of your breasts or when your baby shows signs of hunger. This is called "breastfeeding on demand." Avoid introducing a pacifier to your  baby while you are working to establish breastfeeding (the first 4-6 weeks after your baby is born). After this time you may choose to use a pacifier. Research has shown that pacifier use during the first year of a baby's life decreases the risk of sudden infant death syndrome (SIDS). °Allow your baby to feed on each breast as long as he or she wants. Breastfeed until your baby is finished feeding. When your baby unlatches or falls asleep while feeding from the first breast, offer the second breast. Because newborns are often sleepy in the first few weeks of life, you may need to awaken your baby to get him or her to feed. °Breastfeeding times will vary from baby to baby. However, the following rules can serve as a guide to help you ensure that your baby is properly fed: °· Newborns (babies 4 weeks of age or younger) may breastfeed every 1-3 hours. °· Newborns should not go longer than 3 hours during the day or 5 hours during the night without breastfeeding. °· You should breastfeed your baby a minimum of 8 times in a 24-hour period until you begin to introduce solid foods to your baby at around 6 months of age. °BREAST MILK PUMPING °Pumping and storing breast milk allows you to ensure that your baby is exclusively fed your breast milk, even at times when you are unable to breastfeed. This is especially important if you are going back to work while you are still breastfeeding or when you are not able to be present during feedings. Your lactation consultant can give you guidelines on how long it is safe to store breast milk.  °A breast pump is a machine that allows you to pump milk from your breast into a sterile bottle. The pumped breast milk can then be stored in a refrigerator or freezer. Some breast pumps are operated by hand, while others use electricity. Ask your lactation consultant which type will work best for you. Breast pumps can be purchased, but some hospitals and breastfeeding support groups   lease  breast pumps on a monthly basis. A lactation consultant can teach you how to hand express breast milk, if you prefer not to use a pump.  CARING FOR YOUR BREASTS WHILE YOU BREASTFEED Nipples can become dry, cracked, and sore while breastfeeding. The following recommendations can help keep your breasts moisturized and healthy:  Avoid using soap on your nipples.   Wear a supportive bra. Although not required, special nursing bras and tank tops are designed to allow access to your breasts for breastfeeding without taking off your entire bra or top. Avoid wearing underwire-style bras or extremely tight bras.  Air dry your nipples for 3-6minutes after each feeding.   Use only cotton bra pads to absorb leaked breast milk. Leaking of breast milk between feedings is normal.   Use lanolin on your nipples after breastfeeding. Lanolin helps to maintain your skin's normal moisture barrier. If you use pure lanolin, you do not need to wash it off before feeding your baby again. Pure lanolin is not toxic to your baby. You may also hand express a few drops of breast milk and gently massage that milk into your nipples and allow the milk to air dry. In the first few weeks after giving birth, some women experience extremely full breasts (engorgement). Engorgement can make your breasts feel heavy, warm, and tender to the touch. Engorgement peaks within 3-5 days after you give birth. The following recommendations can help ease engorgement:  Completely empty your breasts while breastfeeding or pumping. You may want to start by applying warm, moist heat (in the shower or with warm water-soaked hand towels) just before feeding or pumping. This increases circulation and helps the milk flow. If your baby does not completely empty your breasts while breastfeeding, pump any extra milk after he or she is finished.  Wear a snug bra (nursing or regular) or tank top for 1-2 days to signal your body to slightly decrease milk  production.  Apply ice packs to your breasts, unless this is too uncomfortable for you.  Make sure that your baby is latched on and positioned properly while breastfeeding. If engorgement persists after 48 hours of following these recommendations, contact your health care provider or a Science writer. OVERALL HEALTH CARE RECOMMENDATIONS WHILE BREASTFEEDING  Eat healthy foods. Alternate between meals and snacks, eating 3 of each per day. Because what you eat affects your breast milk, some of the foods may make your baby more irritable than usual. Avoid eating these foods if you are sure that they are negatively affecting your baby.  Drink milk, fruit juice, and water to satisfy your thirst (about 10 glasses a day).   Rest often, relax, and continue to take your prenatal vitamins to prevent fatigue, stress, and anemia.  Continue breast self-awareness checks.  Avoid chewing and smoking tobacco.  Avoid alcohol and drug use. Some medicines that may be harmful to your baby can pass through breast milk. It is important to ask your health care provider before taking any medicine, including all over-the-counter and prescription medicine as well as vitamin and herbal supplements. It is possible to become pregnant while breastfeeding. If birth control is desired, ask your health care provider about options that will be safe for your baby. SEEK MEDICAL CARE IF:   You feel like you want to stop breastfeeding or have become frustrated with breastfeeding.  You have painful breasts or nipples.  Your nipples are cracked or bleeding.  Your breasts are red, tender, or warm.  You have  a swollen area on either breast. °· You have a fever or chills. °· You have nausea or vomiting. °· You have drainage other than breast milk from your nipples. °· Your breasts do not become full before feedings by the fifth day after you give birth. °· You feel sad and depressed. °· Your baby is too sleepy to eat  well. °· Your baby is having trouble sleeping.   °· Your baby is wetting less than 3 diapers in a 24-hour period. °· Your baby has less than 3 stools in a 24-hour period. °· Your baby's skin or the white part of his or her eyes becomes yellow.   °· Your baby is not gaining weight by 5 days of age. °SEEK IMMEDIATE MEDICAL CARE IF:  °· Your baby is overly tired (lethargic) and does not want to wake up and feed. °· Your baby develops an unexplained fever. °Document Released: 06/05/2005 Document Revised: 06/10/2013 Document Reviewed: 11/27/2012 °[employer]® Patient Information ©2015 [employer]. This information is not intended to replace advice given to you by your health care provider. Make sure you discuss any questions you have with your health care provider. ° °

## 2014-02-01 NOTE — Progress Notes (Signed)
Patient ID: Casey Todd, female   DOB: Mar 29, 1981, [age] y.o.   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 Pain well-controlled with ibuprofen and Percocet. Normal vaginal bleeding, no clots.      Objective:  Temp:  [97.6 F (36.4 C)-98 F (36.7 C)] 98 F (36.7 C) (08/16 0600) Pulse Rate:  [81-84] 84 (08/16 0600) Resp:  [18-20] 20 (08/16 0600) BP: (113-126)/(80-81) 113/81 mmHg (08/16 0600)  No intake or output data in the 24 hours ending 02/01/14 1048     Recent Labs  01/30/14 2025 01/31/14 0610  WBC 14.2* 15.8*  HGB 13.4 11.5*  HCT 39.2 34.0*  PLT 203 188    Blood type: --/--/O POS (08/14 2025) Rubella:  Immune    Physical Exam:  General: alert, cooperative and no distress Uterine Fundus: firm, midline, U-2 Lochia: appropriate Perineum: repair healing well, edema mild - no ice pack in place DVT Evaluation: No evidence of DVT seen on physical exam. Negative Homan's sign. No cords or calf tenderness. No significant calf/ankle edema.    Assessment/Plan: PPD # 2 / 33 y.o., [id number] S/P: spontaneous vaginal   Principal Problem:    Postpartum care following vaginal delivery (8/14)    Normal postpartum exam  Continue current postpartum care  D/C home   LOS: 2 days   Casey Todd, Casey Todd, M, MSN, CNM 02/01/2014, 10:48 AM

## 2014-02-01 NOTE — Lactation Note (Signed)
This note was copied from the chart of Casey Todd. Lactation Consultation Note    Follow up consult with this mom of a term baby, now 7338 hours old. Baby has been cluster feeding, and her nipples are a little tender. On exam, her nipples look WNL, no redness or breakdown.  Ome basic breast feeding teaching done. Mom repoets breast feeding going well. Mom knows to call lactation for questions/conerns  Patient Name: Casey Pearla Dubonnetmy Miggins ZOXWR'UToday's Date: 02/01/2014 Reason for consult: Follow-up assessment   Maternal Data    Feeding Feeding Type: Breast Fed Length of feed: 60 min  LATCH Score/Interventions Latch: Grasps breast easily, tongue down, lips flanged, rhythmical sucking. Intervention(s): Adjust position;Assist with latch  Audible Swallowing: A few with stimulation  Type of Nipple: Everted at rest and after stimulation  Comfort (Breast/Nipple): Soft / non-tender  Problem noted: Mild/Moderate discomfort Interventions (Mild/moderate discomfort): Hand expression  Hold (Positioning): Assistance needed to correctly position infant at breast and maintain latch. Intervention(s): Breastfeeding basics reviewed;Support Pillows;Position options;Skin to skin  LATCH Score: 8  Lactation Tools Discussed/Used     Consult Status Consult Status: Complete Follow-up type: Call as needed    Alfred LevinsLee, Anmol Paschen Anne 02/01/2014, 11:42 AM

## 2014-02-01 NOTE — Discharge Summary (Signed)
Obstetric Discharge Summary  Reason for Admission: Pt is a G2P2002 at 3274w1d presenting in active labor and advanced dilation  [facility] 7.6 [facility], with Marlinda Mikeanya Bailey, CNM as primary provider.  Medications on Admission: Prescriptions prior to admission  Medication Sig Dispense Refill  . calcium carbonate (TUMS - DOSED IN MG ELEMENTAL CALCIUM) 500 MG chewable tablet Chew 2 tablets by mouth daily as needed for heartburn.       . Prenatal Vit-Min-FA-Fish Oil ([employer] PRENATAL GUMMY PO) Take 1 each by mouth daily.        Prenatal Labs: ABO, Rh: --/--/O POS (08/14 2025)  Antibody: NEG (08/14 2025) Rubella:  Immune   RPR: NON REAC (08/14 2025)  HBsAg:   Negative HIV: Non-reactive (12/31 0000)  GTT : 135 mg/dL GBS: Negative (02/8107/09 19140000)   Prenatal Procedures: ultrasound Intrapartum Procedures: spontaneous vaginal delivery Postpartum Procedures: none Complications-Operative and Postpartum: 2nd degree vaginal/perineal/labial minora laceration  Labs: Hemoglobin  Date Value Ref Range Status  01/31/2014 11.5* 12.0 - 15.0 g/dL Final     HCT  Date Value Ref Range Status  01/31/2014 34.0* 36.0 - 46.0 % Final   Lab Results  Component Value Date   PLT 188 01/31/2014    Intrapartum Course: Admitted in active labor / rapid progression to complete dilation / SVD of viable female infant by Raelyn Moraolitta Quita Mcgrory, CNM / retained placenta >60 mins - spontaneous delivery after emptying bladder and placing baby to breast to suckle / 2nd degree vaginal/perineal/labia minora laceration repaired by CNM / no immediate postpartum complication  Newborn Data: Live born female on 01/30/2014 Birth Weight: 7 lb 0.9 oz (3200 g) APGAR: 8, 9  Home with mother.   Discharge Information: Date: 02/01/2014 Discharge Diagnoses:  Pt is a G2P2002 at 7874w1d S/P Term Pregnancy-delivered on 01/30/2014  Condition: stable Activity: pelvic rest Diet: routine Medications:    Medication List          calcium carbonate 500 MG chewable tablet  Commonly known as:  TUMS - dosed in mg elemental calcium  Chew 2 tablets by mouth daily as needed for heartburn.     [employer] PRENATAL GUMMY PO  Take 1 each by mouth daily.     ferrous sulfate 325 (65 FE) MG tablet  Take 1 tablet (325 mg total) by mouth 2 (two) times daily with a meal.     ibuprofen 600 MG tablet  Commonly known as:  ADVIL,MOTRIN  Take 1 tablet (600 mg total) by mouth every 6 (six) hours.     oxyCODONE-acetaminophen 5-325 MG per tablet  Commonly known as:  PERCOCET/ROXICET  Take 1-2 tablets by mouth every 4 (four) hours as needed for severe pain (moderate - severe pain).       Instructions: [facility] book[facility] has been given and reviewed Discharge to: home     Follow-up Information   Follow up with Marlinda MikeBAILEY, TANYA, CNM. Schedule an appointment as soon as possible for a visit in 6 weeks. (postpartum visit)    Specialty:  Obstetrics and Gynecology   Contact information:   [address] [place] [id number] [phone]       [name], Chantil Bari, Judie PetitM, MSN, CNM 02/01/2014, 10:52 AM

## 2014-04-20 ENCOUNTER — Encounter (HOSPITAL_COMMUNITY): Payer: Self-pay | Admitting: *Deleted

## 2014-05-01 IMAGING — US US FETAL BPP W/O NONSTRESS
1 series · 12 of 12 positions shown · non-contrast
Comparison: none

CLINICAL DATA: Decreased fetal movement

[Series 1: us fetal bpp w/o nonstress · non-contrast · 12 acquisitions, 12 frames shown]
[im 1/12]
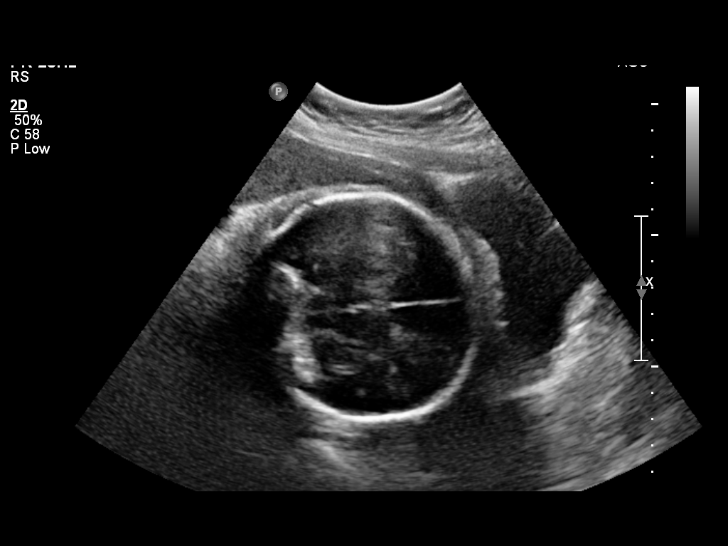
[im 2/12]
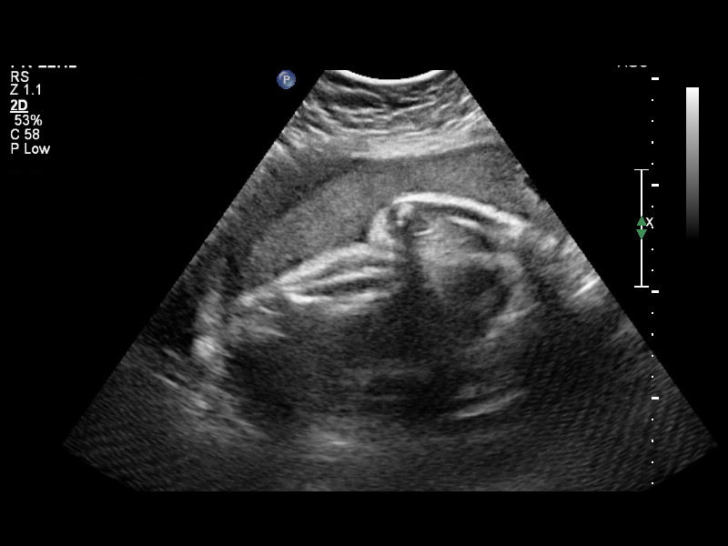
[im 3/12]
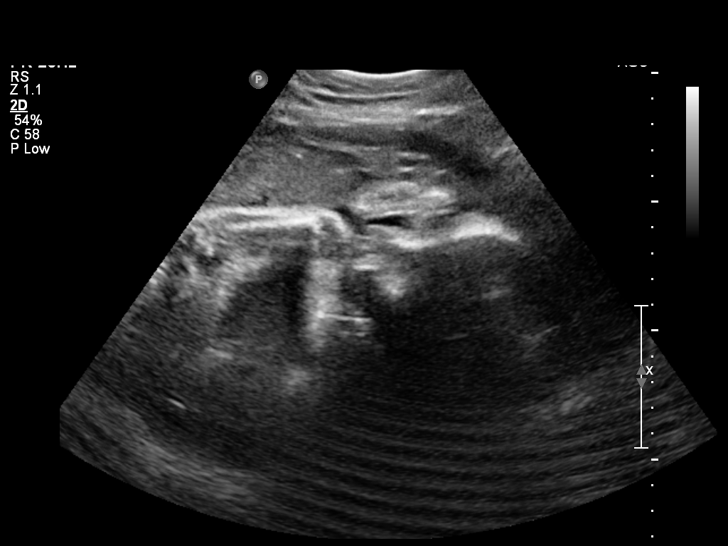
[im 4/12]
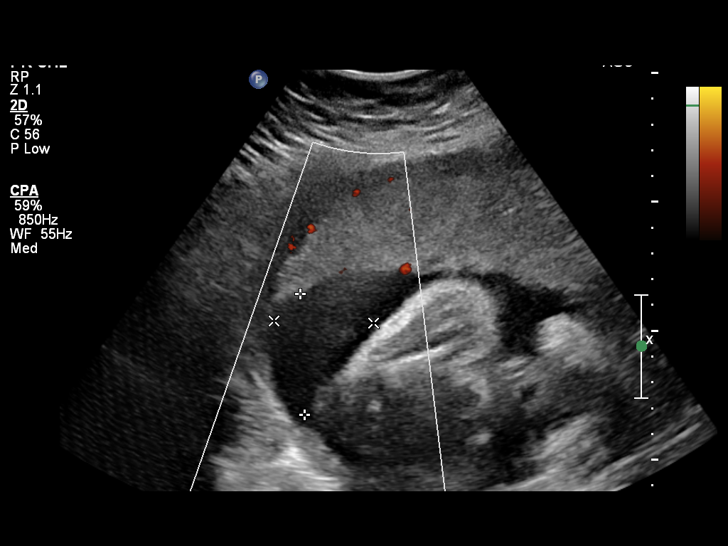
[im 5/12]
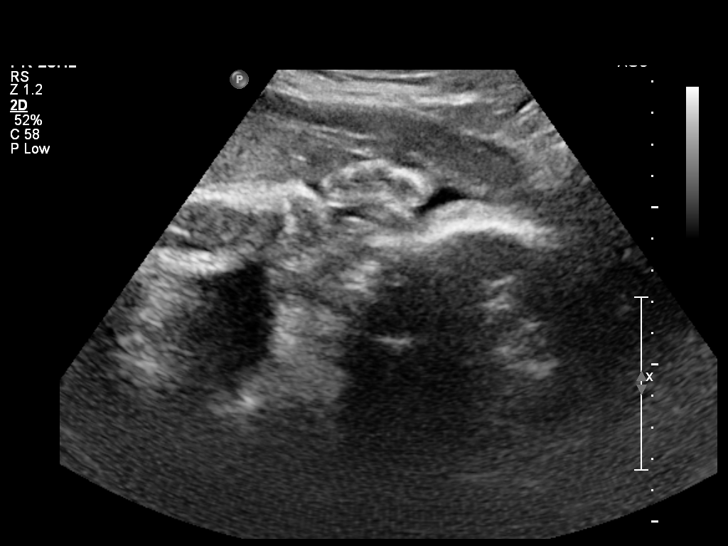
[im 6/12]
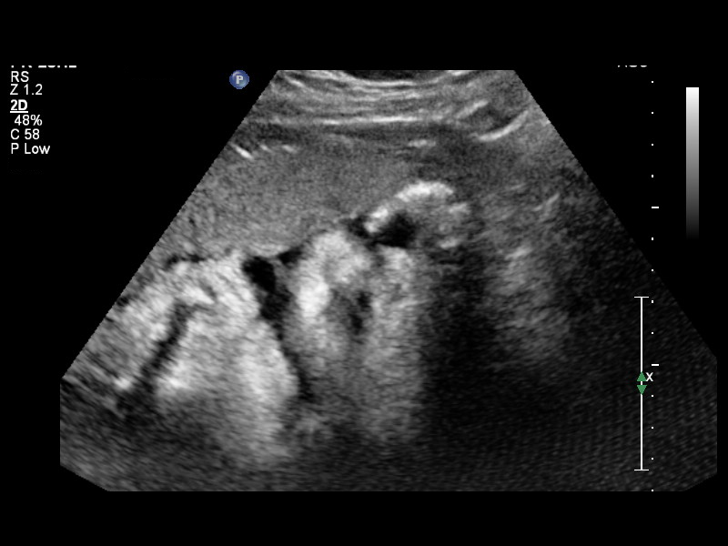
[im 7/12]
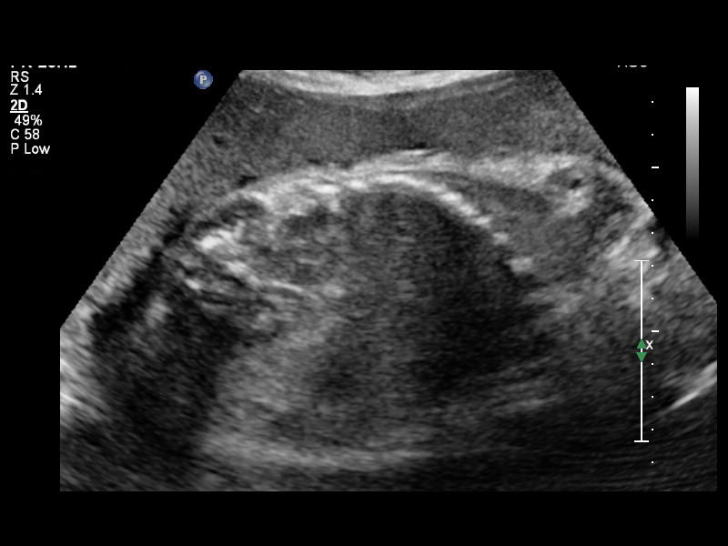
[im 8/12]
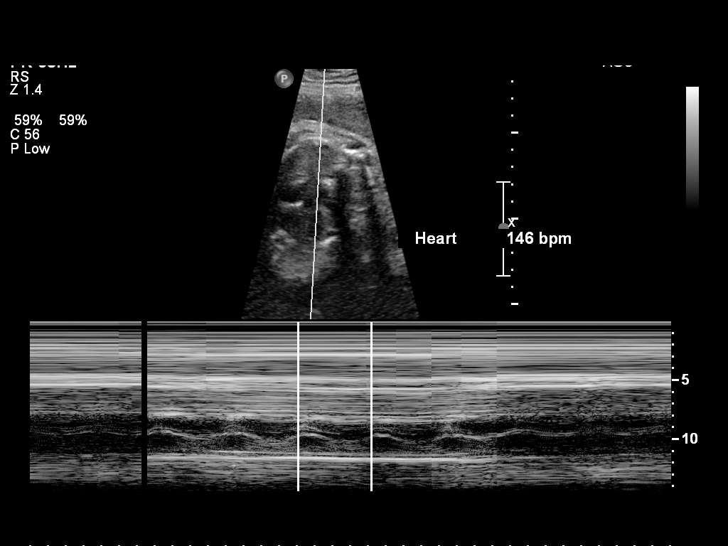
[im 9/12]
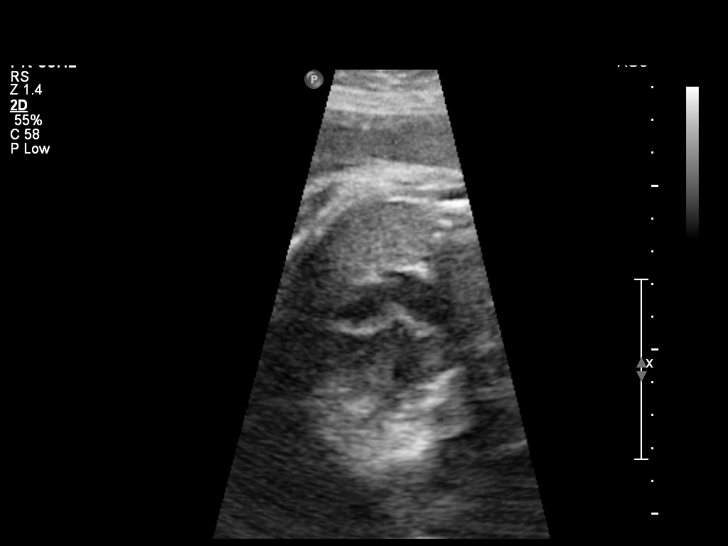
[im 10/12]
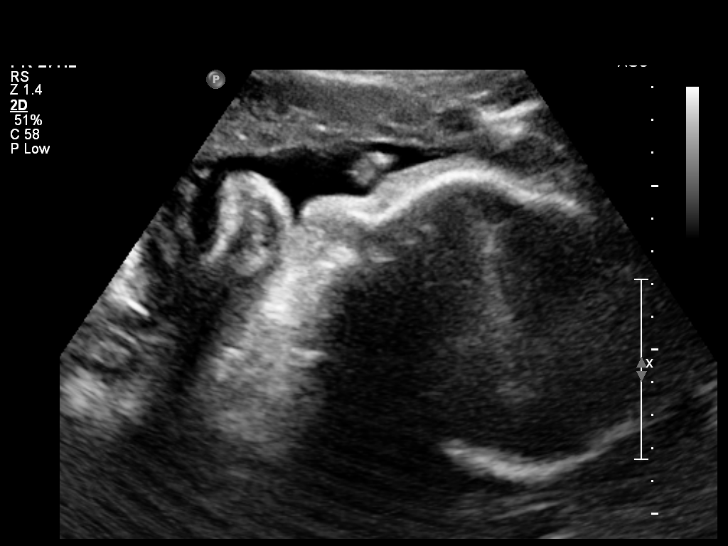
[im 11/12]
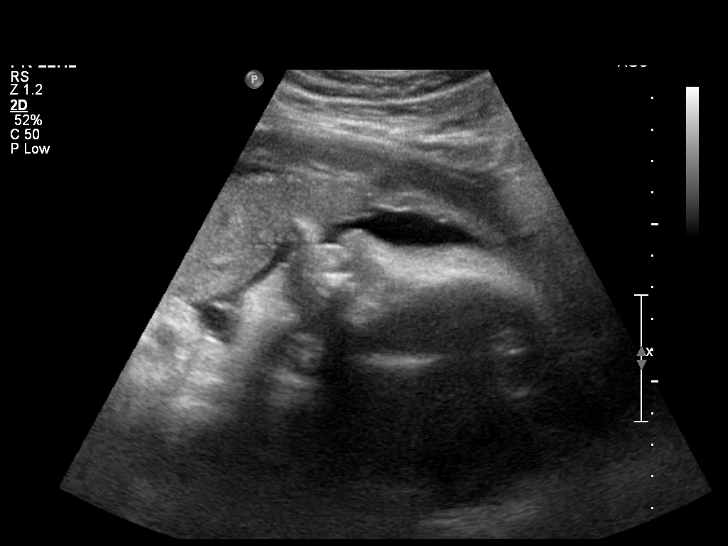
[im 12/12]
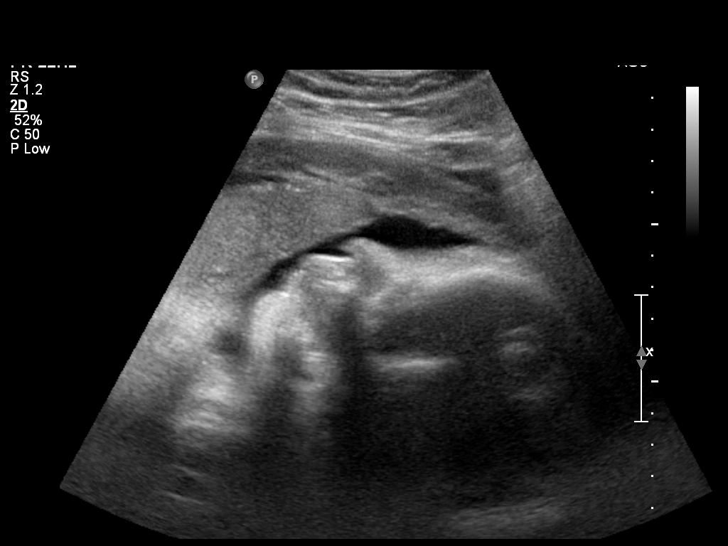

[12 of 12 positions shown; findings below may reference images not displayed]

ULTRAOUND FETAL BPP W/O NONSTRESS

BIOPHYSICAL PROFILE

Number of Fetuses: 1
Heart Rate:  651bpm
Movement:  Yes
Presentation:  Cephalic
Placental Location:  Not assessed
Previa:  N/A
Amniotic Fluid (subjective):  Not assessed

MATERNAL FINDINGS:
Cervix:  Not assessed
Uterus/Adnexae:  Not assessed

BPP:
Movement:  2      Time:  8 min
Breathing:  2
Tone:  2
Amniotic Fluid:  2

Total Score:  [DATE]
IMPRESSION: Single live intrauterine gestation in cephalic presentation.
Fetal BPP [DATE].

This exam was performed on an emergent basis and did not
comprehensively evaluate fetal size, dating, or anatomy, and a
follow-up complete OB US should be considered if further fetal
assessment is warranted.
